# Patient Record
Sex: Female | Born: 1990 | Race: Black or African American | Hispanic: No | Marital: Single
Health system: Southern US, Community
[De-identification: ages and names within clinical notes are randomized; demographics above are authoritative.]

## PROBLEM LIST (undated history)

## (undated) ENCOUNTER — Inpatient Hospital Stay (HOSPITAL_COMMUNITY): Payer: Self-pay

## (undated) DIAGNOSIS — A549 Gonococcal infection, unspecified: Secondary | ICD-10-CM

## (undated) DIAGNOSIS — A599 Trichomoniasis, unspecified: Secondary | ICD-10-CM

## (undated) DIAGNOSIS — Z789 Other specified health status: Secondary | ICD-10-CM

## (undated) DIAGNOSIS — Z8619 Personal history of other infectious and parasitic diseases: Secondary | ICD-10-CM

## (undated) HISTORY — PX: NO PAST SURGERIES: SHX2092

## (undated) HISTORY — DX: Personal history of other infectious and parasitic diseases: Z86.19

---
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
## 2009-07-03 ENCOUNTER — Inpatient Hospital Stay (HOSPITAL_COMMUNITY): Admission: AD | Admit: 2009-07-03 | Discharge: 2009-07-03 | Payer: Self-pay | Admitting: Family Medicine

## 2009-10-27 ENCOUNTER — Ambulatory Visit (HOSPITAL_COMMUNITY): Admission: RE | Admit: 2009-10-27 | Discharge: 2009-10-27 | Payer: Self-pay | Admitting: Family Medicine

## 2010-02-01 ENCOUNTER — Inpatient Hospital Stay (HOSPITAL_COMMUNITY)
Admission: AD | Admit: 2010-02-01 | Discharge: 2010-02-01 | Payer: Self-pay | Source: Home / Self Care | Attending: Obstetrics and Gynecology | Admitting: Obstetrics and Gynecology

## 2010-02-01 ENCOUNTER — Encounter (INDEPENDENT_AMBULATORY_CARE_PROVIDER_SITE_OTHER): Payer: Self-pay | Admitting: Obstetrics and Gynecology

## 2010-02-01 ENCOUNTER — Inpatient Hospital Stay (HOSPITAL_COMMUNITY)
Admission: AD | Admit: 2010-02-01 | Discharge: 2010-02-03 | Payer: Self-pay | Source: Home / Self Care | Attending: Obstetrics and Gynecology | Admitting: Obstetrics and Gynecology

## 2010-02-01 LAB — CBC
HCT: 31.4 % — ABNORMAL LOW (ref 36.0–46.0)
Hemoglobin: 9.8 g/dL — ABNORMAL LOW (ref 12.0–15.0)
MCH: 24 pg — ABNORMAL LOW (ref 26.0–34.0)
MCHC: 31.2 g/dL (ref 30.0–36.0)
MCV: 77 fL — ABNORMAL LOW (ref 78.0–100.0)
Platelets: 255 10*3/uL (ref 150–400)
RBC: 4.08 MIL/uL (ref 3.87–5.11)
RDW: 16.2 % — ABNORMAL HIGH (ref 11.5–15.5)
WBC: 8.8 10*3/uL (ref 4.0–10.5)

## 2010-02-01 LAB — RPR: RPR Ser Ql: NONREACTIVE

## 2010-02-02 LAB — CBC
HCT: 26.7 % — ABNORMAL LOW (ref 36.0–46.0)
Hemoglobin: 8.2 g/dL — ABNORMAL LOW (ref 12.0–15.0)
MCH: 24.1 pg — ABNORMAL LOW (ref 26.0–34.0)
MCHC: 30.7 g/dL (ref 30.0–36.0)
MCV: 78.5 fL (ref 78.0–100.0)
Platelets: 236 10*3/uL (ref 150–400)
RBC: 3.4 MIL/uL — ABNORMAL LOW (ref 3.87–5.11)
RDW: 16.4 % — ABNORMAL HIGH (ref 11.5–15.5)
WBC: 9.1 10*3/uL (ref 4.0–10.5)

## 2010-04-16 LAB — CBC
HCT: 39.9 % (ref 36.0–46.0)
Hemoglobin: 13.7 g/dL (ref 12.0–15.0)
MCHC: 34.3 g/dL (ref 30.0–36.0)
MCV: 94.3 fL (ref 78.0–100.0)
Platelets: 266 10*3/uL (ref 150–400)
RBC: 4.23 MIL/uL (ref 3.87–5.11)
RDW: 13.9 % (ref 11.5–15.5)
WBC: 8 10*3/uL (ref 4.0–10.5)

## 2010-04-16 LAB — URINALYSIS, ROUTINE W REFLEX MICROSCOPIC
Bilirubin Urine: NEGATIVE
Glucose, UA: NEGATIVE mg/dL
Hgb urine dipstick: NEGATIVE
Ketones, ur: NEGATIVE mg/dL
Nitrite: NEGATIVE
Protein, ur: NEGATIVE mg/dL
Specific Gravity, Urine: >1.03 — ABNORMAL HIGH (ref 1.005–1.030)
Urobilinogen, UA: 0.2 mg/dL (ref 0.0–1.0)
pH: 6 (ref 5.0–8.0)

## 2010-04-16 LAB — POCT PREGNANCY, URINE: Preg Test, Ur: POSITIVE

## 2010-04-16 LAB — GC/CHLAMYDIA PROBE AMP, GENITAL
Chlamydia, DNA Probe: NEGATIVE
GC Probe Amp, Genital: POSITIVE — AB

## 2010-04-16 LAB — WET PREP, GENITAL
Clue Cells Wet Prep HPF POC: NONE SEEN
Yeast Wet Prep HPF POC: NONE SEEN

## 2010-04-16 LAB — HCG, QUANTITATIVE, PREGNANCY: hCG, Beta Chain, Quant, S: 61884 m[IU]/mL — ABNORMAL HIGH (ref <5)

## 2010-05-10 LAB — URINE CULTURE: Colony Count: >=100000

## 2010-05-10 LAB — URINALYSIS, ROUTINE W REFLEX MICROSCOPIC
Bilirubin Urine: NEGATIVE
Glucose, UA: NEGATIVE mg/dL
Ketones, ur: NEGATIVE mg/dL
Nitrite: NEGATIVE
Protein, ur: 100 mg/dL — AB
Specific Gravity, Urine: 1.022 (ref 1.005–1.030)
Urobilinogen, UA: 1 mg/dL (ref 0.0–1.0)
pH: 7.5 (ref 5.0–8.0)

## 2010-05-10 LAB — POCT PREGNANCY, URINE: Preg Test, Ur: NEGATIVE

## 2010-05-10 LAB — URINE MICROSCOPIC-ADD ON

## 2010-06-12 NOTE — Discharge Summary (Signed)
Kristina Goodwin, Kristina Goodwin NO.:  [id number]   MEDICAL RECORD NO.:  [id number]          PATIENT TYPE:  INP   LOCATION:  9126                          FACILITY:  [facility]   PHYSICIAN:  Gerrit Friends. Aldona Bar, M.D.   DATE OF BIRTH:  [birth year]   DATE OF ADMISSION:  09/05/2007  DATE OF DISCHARGE:  09/08/2007                               DISCHARGE SUMMARY   DISCHARGE DIAGNOSES:  1. Term pregnancy, delivered 7 pounds 7 ounces female infant, Apgars 9      and 9.  2. Blood type O positive.  3. Postpartum hemorrhage and anemia.   PROCEDURE:  1. Normal spontaneous delivery.  2. Second-degree tear and repair.   SUMMARY:  This 20 year old primigravida presented at 40 weeks and 6 days  with ruptured membranes.  Her pregnancy was relatively benign.  At the  time of presentation, her cervix was 1-2 cm dilated, vertex was high.  Cervix was 50% effaced.  She was begun on intravenous Pitocin and  eventually began progressing and subsequently on the afternoon of August  2009, delivered a 7 pounds 7 ounces female infant with Apgar hours of 9  and 9 over second-degree tear, which was repaired without difficulty.  The patient did have some postpartum bleeding.  She was examined by Dr.  Edward Jolly in the early morning of September 07, 2007.  No vaginal hematoma was  palpable.  She was swollen.  The uterus was firm.  She was given  Methergine and did contract well and her subsequent hemoglobins were  stable with the final one being 7.2 on the morning of September 08, 2007.  At that time, her white counts was 10,800.  Her platelet count was  250,000.  On the morning of September 08, 2007, she was stable.  Uterus was  firm.  Bleeding was minimal.  Vital signs were stable.  The patient was  bottle feeding and was instructed on breast finders.  She was also given  all appropriate instructions per discharge brochure and understood all  instructions well.  Discharge medications include Feosol capsules - one  daily.   She will finish her prenatal vitamins - one daily and she was  given prescriptions for Tylox to use 1-2 every 4-6 hours as needed for  severe pain and Motrin 600 mg to use every 6 hours as needed for  cramping or pain.  She will return to the office for followup in  approximately 4 weeks' time or as needed.   CONDITION ON DISCHARGE:  Improved.      Gerrit Friends. Aldona Bar, M.D.  Electronically Signed     RMW/MEDQ  D:  09/08/2007  T:  09/08/2007  Job:  [id number]

## 2010-10-26 LAB — CBC
HCT: 22.5 — ABNORMAL LOW
HCT: 23.5 — ABNORMAL LOW
HCT: 28.4 — ABNORMAL LOW
Hemoglobin: 7.2 — CL
Hemoglobin: 7.5 — CL
Hemoglobin: 9.1 — ABNORMAL LOW
MCHC: 31.8
MCHC: 31.9
MCHC: 32
MCV: 78
MCV: 78.1
MCV: 78.8
Platelets: 216
Platelets: 250
Platelets: 289
RBC: 2.86 — ABNORMAL LOW
RBC: 3.01 — ABNORMAL LOW
RBC: 3.64 — ABNORMAL LOW
RDW: 18.8 — ABNORMAL HIGH
RDW: 18.8 — ABNORMAL HIGH
RDW: 19.1 — ABNORMAL HIGH
WBC: 10.8 — ABNORMAL HIGH
WBC: 17.5 — ABNORMAL HIGH
WBC: 8.6

## 2010-10-26 LAB — RPR: RPR Ser Ql: NONREACTIVE

## 2011-01-29 NOTE — L&D Delivery Note (Signed)
Delivery Note At 9:46 PM a viable and healthy female was delivered via  (Presentation: right occiput; anterior  ).  APGAR: 8, 9 ; weight Pending.   Placenta status: Intact, Spontaneous.  Cord: 3V   Pt was found to be completely dilated with an urge to push.  With 3-4 contractions she delivered the head to crowning.  With delivery of the head the anterior shoulder did not easily deliver. The patient was placed in McRoberts position and additional assistants were called to the room.  After the patient was placed in McRoberts the anterior shoulder easily delivered followed by the remainder of the body.  Infant cried vigorously after stimulation and was placed on the maternal abdomen.  Cord was clamped and cut and the placenta was delivered spontaneously, intact, with 3V cord.  No lacerations required repair.  Mother and baby are doing well after delivery.  Duration of shoulder dystocia less than 15 seconds.  EBL 350 cc  Anesthesia: Epidural  Episiotomy: None Lacerations: None Suture Repair: None Est. Blood Loss (mL): 350cc  Mom to postpartum.  Baby to nursery-stable.  Jude Linck H. 11/19/2011, 10:06 PM

## 2011-04-12 ENCOUNTER — Encounter (HOSPITAL_COMMUNITY): Payer: Self-pay | Admitting: *Deleted

## 2011-04-12 ENCOUNTER — Inpatient Hospital Stay (HOSPITAL_COMMUNITY)
Admission: AD | Admit: 2011-04-12 | Discharge: 2011-04-12 | Disposition: A | Discharge status: Home / Self Care | Payer: Self-pay | Source: Ambulatory Visit | Attending: Obstetrics & Gynecology | Admitting: Obstetrics & Gynecology

## 2011-04-12 DIAGNOSIS — O210 Mild hyperemesis gravidarum: Secondary | ICD-10-CM | POA: Insufficient documentation

## 2011-04-12 DIAGNOSIS — N898 Other specified noninflammatory disorders of vagina: Secondary | ICD-10-CM

## 2011-04-12 DIAGNOSIS — O219 Vomiting of pregnancy, unspecified: Secondary | ICD-10-CM

## 2011-04-12 DIAGNOSIS — R109 Unspecified abdominal pain: Secondary | ICD-10-CM | POA: Insufficient documentation

## 2011-04-12 HISTORY — DX: Trichomoniasis, unspecified: A59.9

## 2011-04-12 HISTORY — DX: Gonococcal infection, unspecified: A54.9

## 2011-04-12 LAB — URINALYSIS, ROUTINE W REFLEX MICROSCOPIC
Bilirubin Urine: NEGATIVE
Glucose, UA: NEGATIVE mg/dL
Ketones, ur: 15 mg/dL — AB
Leukocytes, UA: NEGATIVE
Nitrite: NEGATIVE
Protein, ur: NEGATIVE mg/dL
Specific Gravity, Urine: 1.025 (ref 1.005–1.030)
Urobilinogen, UA: 1 mg/dL (ref 0.0–1.0)
pH: 6.5 (ref 5.0–8.0)

## 2011-04-12 LAB — URINE MICROSCOPIC-ADD ON

## 2011-04-12 LAB — WET PREP, GENITAL: Clue Cells Wet Prep HPF POC: NONE SEEN

## 2011-04-12 LAB — POCT PREGNANCY, URINE: Preg Test, Ur: POSITIVE — AB

## 2011-04-12 LAB — PREGNANCY, URINE: Preg Test, Ur: POSITIVE — AB

## 2011-04-12 MED ORDER — ONDANSETRON HCL 4 MG PO TABS
4.0000 mg | ORAL_TABLET | Freq: Once | ORAL | Status: AC
  Administered 2011-04-12: 4 mg via ORAL
  Filled 2011-04-12: qty 1

## 2011-04-12 MED ORDER — ONDANSETRON HCL 4 MG PO TABS: 4.0000 mg | ORAL_TABLET | Freq: Four times a day (QID) | ORAL | Status: AC

## 2011-04-12 NOTE — Discharge Instructions (Signed)
Morning Sickness Morning sickness is when you feel sick to your stomach (nauseous) during pregnancy. You may feel sick to your stomach and throw up (vomit). You may feel sick in the morning, but you can feel this way any time of day. Some women feel very sick to their stomach and cannot stop throwing up (hyperemesis gravidarum). HOME CARE  Take multivitamins as told by your doctor. Taking multivitamins before getting pregnant can stop or lessen the harshness of morning sickness.   Eat dry toast or unsalted crackers before getting out of bed.   Eat 5 to 6 small meals a day.   Eat dry and bland foods like rice and baked potatoes.   Do not drink liquids with meals. Drink between meals.   Do not eat greasy, fatty, or spicy foods.   Have someone cook for you if the smell of food causes you to feel sick or throw up.   Do not take vitamins with iron, or as told by your doctor.   Eat protein when you need a snack (nuts, yogurt, cheese).   Eat unsweetened gelatins for dessert.   Wear a bracelet used for sea sickness (acupressure wristband).   Go to a doctor that puts thin needles into certain body points (acupuncture) to improve how you feel.   Do not smoke.   Use a humidifier to keep the air in your house free of odors.  GET HELP RIGHT AWAY IF:   You feel very sick to your stomach and cannot stop throwing up.   You pass out (faint).   You have a fever.   You need medicine to feel better.   You feel dizzy or lightheaded.   You are losing weight.   You need help knowing what to eat and what not to eat.  MAKE SURE YOU:   Understand these instructions.   Will watch your condition.   Will get help right away if you are not doing well or get worse.  Document Released: 02/22/2004 Document Revised: 01/03/2011 Document Reviewed: 04/13/2009 Exit[employer] Patient Information 2012 [employer]. 
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
## 2011-04-12 NOTE — MAU Note (Signed)
Pt has been nauseated x2 weeks.

## 2011-04-12 NOTE — MAU Provider Note (Signed)
History   Kristina Goodwin is a 21 yo G3P2002 at [redacted]w[redacted]d presenting with nausea, vomiting, and pelvic pain.   CSN: [id number]  Arrival date and time: 04/12/11 1318   First Provider Initiated Contact with Patient 04/12/11 1535      Chief Complaint  Patient presents with  . Abdominal Pain   HPI Kristina Goodwin is a 21 yo G3P2002 at [redacted]w[redacted]d presenting with nausea, vomiting, and pelvic pain for 2 weeks.  She describes the pelvic pain as intermittent cramping similar to menstrual cramps rated 4-5/10.  She is able to keep most fluids down, but only able minimal solid foods.  She also states she has had a thick whitish discharge for 2 weeks.  She had a trichomonas infection about 1 month ago.  She denies diarrhea, fever, body aches, chills, dysuria, new sexual partners.  She did not know she was pregnant until her pregnancy test done here.  She plans on returning to [facility] care.    OB History    Grav Para Term Preterm Abortions TAB SAB Ect Mult Living   3 2 2  0 0 0 0 0 0 2      Past Medical History  Diagnosis Date  . Trichomonas   . Gonorrhea     History reviewed. No pertinent past surgical history.  Family History  Problem Relation Age of Onset  . Asthma Father   . Asthma Brother   . Diabetes Maternal Uncle   . Diabetes Maternal Grandmother     History  Substance Use Topics  . Smoking status: Current Everyday Smoker -- 0.2 packs/day    Types: Cigarettes  . Smokeless tobacco: Never Used  . Alcohol Use: No    Allergies: No Known Allergies  No prescriptions prior to admission    Review of Systems  Constitutional: Negative for fever, chills and malaise/fatigue.  Gastrointestinal: Positive for nausea, vomiting and abdominal pain (pelvic cramping). Negative for diarrhea.  Genitourinary: Negative for dysuria and urgency.   Physical Exam   Blood pressure 130/75, pulse 111, temperature 97.5 F (36.4 C), temperature source Oral, resp. rate 16, height 5\' 4"   (1.626 m), last menstrual period 02/12/2011, SpO2 100.00%.  Physical Exam  Constitutional: She is oriented to person, place, and time. She appears well-developed and well-nourished. No distress.  HENT:  Head: Normocephalic and atraumatic.  Neck: Neck supple.  Respiratory: Effort normal.  GI: Soft. Bowel sounds are normal. She exhibits no distension. There is no tenderness.  Genitourinary: Cervix exhibits discharge (thick white discharge). Cervix exhibits no motion tenderness and no friability. Right adnexum displays tenderness (mild). Left adnexum displays tenderness (mild). Vaginal discharge (thick white discharge noted) found.  Musculoskeletal: Normal range of motion.  Neurological: She is alert and oriented to person, place, and time.    MAU Course  Procedures Results for orders placed during the hospital encounter of 04/12/11 (from the past 24 hour(s))  URINALYSIS, ROUTINE W REFLEX MICROSCOPIC     Status: Abnormal   Collection Time   04/12/11  2:00 PM      Component Value Range   Color, Urine AMBER (*) YELLOW    APPearance HAZY (*) CLEAR    Specific Gravity, Urine 1.025  1.005 - 1.030    pH 6.5  5.0 - 8.0    Glucose, UA NEGATIVE  NEGATIVE (mg/dL)   Hgb urine dipstick TRACE (*) NEGATIVE    Bilirubin Urine NEGATIVE  NEGATIVE    Ketones, ur 15 (*) NEGATIVE (mg/dL)   Protein,  ur NEGATIVE  NEGATIVE (mg/dL)   Urobilinogen, UA 1.0  0.0 - 1.0 (mg/dL)   Nitrite NEGATIVE  NEGATIVE    Leukocytes, UA NEGATIVE  NEGATIVE   PREGNANCY, URINE     Status: Abnormal   Collection Time   04/12/11  2:00 PM      Component Value Range   Preg Test, Ur POSITIVE (*) NEGATIVE   URINE MICROSCOPIC-ADD ON     Status: Abnormal   Collection Time   04/12/11  2:00 PM      Component Value Range   Squamous Epithelial / LPF FEW (*) RARE    WBC, UA 7-10  <3 (WBC/hpf)   RBC / HPF 7-10  <3 (RBC/hpf)   Bacteria, UA MANY (*) RARE    Urine-Other MUCOUS PRESENT    POCT PREGNANCY, URINE     Status: Abnormal    Collection Time   04/12/11  2:06 PM      Component Value Range   Preg Test, Ur POSITIVE (*) NEGATIVE   WET PREP, GENITAL     Status: Abnormal   Collection Time   04/12/11  3:39 PM      Component Value Range   Yeast Wet Prep HPF POC NONE SEEN  NONE SEEN    Trich, Wet Prep NONE SEEN  NONE SEEN    Clue Cells Wet Prep HPF POC NONE SEEN  NONE SEEN    WBC, Wet Prep HPF POC FEW (*) NONE SEEN     Assessment and Plan  Assessment Pregnancy  Nausea and vomiting  Plan Obtained samples for pregnancy, UA with micro, GC/Ch, wet prep.  Results reviewed and listed above except GC/Ch.  Will call patient with results and treatment if positive.     PO Zofran for N/V given in MAU.  Informed patient to continue to eat small frequent meals and to drink plenty of fluid. Patient to seek new OB appointment with [facility].   Discharge home.  Mardene Speak 04/12/2011, 3:46 PM   I have seen and examined this patient in conjunction with Mardene Speak, PA-S.  I have taken this history and performed the exam.  I agree with the note as written above and have made corrections as needed.   Candelaria Celeste JEHIEL 04/12/2011 4:36 PM

## 2011-04-15 ENCOUNTER — Other Ambulatory Visit: Payer: Self-pay | Admitting: Family Medicine

## 2011-04-15 LAB — GC/CHLAMYDIA PROBE AMP, GENITAL: Chlamydia, DNA Probe: POSITIVE — AB

## 2011-04-15 MED ORDER — AZITHROMYCIN 500 MG PO TABS: 1000.0000 mg | ORAL_TABLET | Freq: Once | ORAL | Status: AC

## 2011-07-01 ENCOUNTER — Other Ambulatory Visit: Payer: Self-pay

## 2011-07-04 LAB — OB RESULTS CONSOLE HIV ANTIBODY (ROUTINE TESTING): HIV: NONREACTIVE

## 2011-07-04 LAB — OB RESULTS CONSOLE ANTIBODY SCREEN: Antibody Screen: NEGATIVE

## 2011-07-04 LAB — OB RESULTS CONSOLE RPR: RPR: NONREACTIVE

## 2011-07-04 LAB — OB RESULTS CONSOLE HEPATITIS B SURFACE ANTIGEN: Hepatitis B Surface Ag: NEGATIVE

## 2011-10-15 LAB — OB RESULTS CONSOLE GBS: GBS: POSITIVE

## 2011-11-18 ENCOUNTER — Encounter (HOSPITAL_COMMUNITY): Payer: Self-pay | Admitting: *Deleted

## 2011-11-18 ENCOUNTER — Telehealth (HOSPITAL_COMMUNITY): Payer: Self-pay | Admitting: *Deleted

## 2011-11-18 NOTE — Telephone Encounter (Signed)
Preadmission screen  

## 2011-11-19 ENCOUNTER — Inpatient Hospital Stay (HOSPITAL_COMMUNITY): Payer: Medicaid Other | Admitting: Anesthesiology

## 2011-11-19 ENCOUNTER — Encounter (HOSPITAL_COMMUNITY): Payer: Self-pay | Admitting: Anesthesiology

## 2011-11-19 ENCOUNTER — Encounter (HOSPITAL_COMMUNITY): Payer: Self-pay | Admitting: *Deleted

## 2011-11-19 ENCOUNTER — Encounter (HOSPITAL_COMMUNITY): Payer: Self-pay | Admitting: Family Medicine

## 2011-11-19 ENCOUNTER — Inpatient Hospital Stay (HOSPITAL_COMMUNITY)
Admission: AD | Admit: 2011-11-19 | Discharge: 2011-11-21 | DRG: 775 | Disposition: A | Discharge status: Home / Self Care | Payer: Medicaid Other | Source: Ambulatory Visit | Attending: Obstetrics and Gynecology | Admitting: Obstetrics and Gynecology

## 2011-11-19 DIAGNOSIS — O2650 Maternal hypotension syndrome, unspecified trimester: Secondary | ICD-10-CM | POA: Diagnosis not present

## 2011-11-19 DIAGNOSIS — O9903 Anemia complicating the puerperium: Principal | ICD-10-CM | POA: Diagnosis not present

## 2011-11-19 DIAGNOSIS — D649 Anemia, unspecified: Secondary | ICD-10-CM | POA: Diagnosis not present

## 2011-11-19 LAB — CBC
Hemoglobin: 9 g/dL — ABNORMAL LOW (ref 12.0–15.0)
MCH: 23.6 pg — ABNORMAL LOW (ref 26.0–34.0)
RBC: 3.82 MIL/uL — ABNORMAL LOW (ref 3.87–5.11)
WBC: 10.8 10*3/uL — ABNORMAL HIGH (ref 4.0–10.5)

## 2011-11-19 LAB — RPR: RPR Ser Ql: NONREACTIVE

## 2011-11-19 MED ORDER — ACETAMINOPHEN 325 MG PO TABS: 650.0000 mg | ORAL_TABLET | ORAL | Status: DC | PRN

## 2011-11-19 MED ORDER — EPHEDRINE 5 MG/ML INJ
10.0000 mg | INTRAVENOUS | Status: DC | PRN
  Filled 2011-11-19: qty 4

## 2011-11-19 MED ORDER — SENNOSIDES-DOCUSATE SODIUM 8.6-50 MG PO TABS
2.0000 | ORAL_TABLET | Freq: Every day | ORAL | Status: DC
  Administered 2011-11-20: 2 via ORAL

## 2011-11-19 MED ORDER — IBUPROFEN 600 MG PO TABS: 600.0000 mg | ORAL_TABLET | Freq: Four times a day (QID) | ORAL | Status: DC | PRN

## 2011-11-19 MED ORDER — EPHEDRINE 5 MG/ML INJ: 10.0000 mg | INTRAVENOUS | Status: DC | PRN

## 2011-11-19 MED ORDER — OXYCODONE-ACETAMINOPHEN 5-325 MG PO TABS: 1.0000 | ORAL_TABLET | ORAL | Status: DC | PRN

## 2011-11-19 MED ORDER — SIMETHICONE 80 MG PO CHEW: 80.0000 mg | CHEWABLE_TABLET | ORAL | Status: DC | PRN

## 2011-11-19 MED ORDER — FLEET ENEMA 7-19 GM/118ML RE ENEM: 1.0000 | ENEMA | RECTAL | Status: DC | PRN

## 2011-11-19 MED ORDER — CITRIC ACID-SODIUM CITRATE 334-500 MG/5ML PO SOLN: 30.0000 mL | ORAL | Status: DC | PRN

## 2011-11-19 MED ORDER — FENTANYL 2.5 MCG/ML BUPIVACAINE 1/10 % EPIDURAL INFUSION (WH - ANES)
INTRAMUSCULAR | Status: DC | PRN
  Administered 2011-11-19: 14 mL/h via EPIDURAL

## 2011-11-19 MED ORDER — LIDOCAINE HCL (PF) 1 % IJ SOLN
30.0000 mL | INTRAMUSCULAR | Status: DC | PRN
  Filled 2011-11-19: qty 30

## 2011-11-19 MED ORDER — LACTATED RINGERS IV SOLN: 500.0000 mL | Freq: Once | INTRAVENOUS | Status: DC

## 2011-11-19 MED ORDER — TETANUS-DIPHTH-ACELL PERTUSSIS 5-2.5-18.5 LF-MCG/0.5 IM SUSP
0.5000 mL | Freq: Once | INTRAMUSCULAR | Status: AC
Start: 2011-11-20 — End: 2011-11-20
  Administered 2011-11-20: 0.5 mL via INTRAMUSCULAR
  Filled 2011-11-19 (×2): qty 0.5

## 2011-11-19 MED ORDER — LACTATED RINGERS IV SOLN: 500.0000 mL | INTRAVENOUS | Status: DC | PRN

## 2011-11-19 MED ORDER — DIBUCAINE 1 % RE OINT
1.0000 "application " | TOPICAL_OINTMENT | RECTAL | Status: DC | PRN
  Filled 2011-11-19: qty 28

## 2011-11-19 MED ORDER — ZOLPIDEM TARTRATE 5 MG PO TABS: 5.0000 mg | ORAL_TABLET | Freq: Every evening | ORAL | Status: DC | PRN

## 2011-11-19 MED ORDER — LACTATED RINGERS IV SOLN: INTRAVENOUS | Status: DC

## 2011-11-19 MED ORDER — DIPHENHYDRAMINE HCL 50 MG/ML IJ SOLN: 12.5000 mg | INTRAMUSCULAR | Status: DC | PRN

## 2011-11-19 MED ORDER — DIPHENHYDRAMINE HCL 25 MG PO CAPS: 25.0000 mg | ORAL_CAPSULE | Freq: Four times a day (QID) | ORAL | Status: DC | PRN

## 2011-11-19 MED ORDER — IBUPROFEN 600 MG PO TABS
600.0000 mg | ORAL_TABLET | Freq: Four times a day (QID) | ORAL | Status: DC
  Administered 2011-11-20 – 2011-11-21 (×6): 600 mg via ORAL
  Filled 2011-11-19 (×6): qty 1

## 2011-11-19 MED ORDER — OXYTOCIN BOLUS FROM INFUSION
500.0000 mL | INTRAVENOUS | Status: DC
  Administered 2011-11-19: 500 mL via INTRAVENOUS
  Filled 2011-11-19 (×44): qty 500

## 2011-11-19 MED ORDER — LIDOCAINE HCL (PF) 1 % IJ SOLN
INTRAMUSCULAR | Status: DC | PRN
  Administered 2011-11-19 (×2): 4 mL

## 2011-11-19 MED ORDER — OXYTOCIN 40 UNITS IN LACTATED RINGERS INFUSION - SIMPLE MED
62.5000 mL/h | INTRAVENOUS | Status: DC
  Filled 2011-11-19: qty 1000

## 2011-11-19 MED ORDER — PRENATAL MULTIVITAMIN CH
1.0000 | ORAL_TABLET | Freq: Every day | ORAL | Status: DC
  Administered 2011-11-20: 1 via ORAL
  Filled 2011-11-19 (×2): qty 1

## 2011-11-19 MED ORDER — ONDANSETRON HCL 4 MG/2ML IJ SOLN: 4.0000 mg | INTRAMUSCULAR | Status: DC | PRN

## 2011-11-19 MED ORDER — ONDANSETRON HCL 4 MG/2ML IJ SOLN: 4.0000 mg | Freq: Four times a day (QID) | INTRAMUSCULAR | Status: DC | PRN

## 2011-11-19 MED ORDER — TERBUTALINE SULFATE 1 MG/ML IJ SOLN: 0.2500 mg | Freq: Once | INTRAMUSCULAR | Status: DC | PRN

## 2011-11-19 MED ORDER — FENTANYL 2.5 MCG/ML BUPIVACAINE 1/10 % EPIDURAL INFUSION (WH - ANES)
14.0000 mL/h | INTRAMUSCULAR | Status: DC
  Filled 2011-11-19: qty 125

## 2011-11-19 MED ORDER — WITCH HAZEL-GLYCERIN EX PADS: 1.0000 "application " | MEDICATED_PAD | CUTANEOUS | Status: DC | PRN

## 2011-11-19 MED ORDER — ONDANSETRON HCL 4 MG PO TABS: 4.0000 mg | ORAL_TABLET | ORAL | Status: DC | PRN

## 2011-11-19 MED ORDER — OXYTOCIN 40 UNITS IN LACTATED RINGERS INFUSION - SIMPLE MED
1.0000 m[IU]/min | INTRAVENOUS | Status: DC
  Administered 2011-11-19: 2 m[IU]/min via INTRAVENOUS

## 2011-11-19 MED ORDER — PHENYLEPHRINE 40 MCG/ML (10ML) SYRINGE FOR IV PUSH (FOR BLOOD PRESSURE SUPPORT): 80.0000 ug | PREFILLED_SYRINGE | INTRAVENOUS | Status: DC | PRN

## 2011-11-19 MED ORDER — LANOLIN HYDROUS EX OINT: TOPICAL_OINTMENT | CUTANEOUS | Status: DC | PRN

## 2011-11-19 MED ORDER — PHENYLEPHRINE 40 MCG/ML (10ML) SYRINGE FOR IV PUSH (FOR BLOOD PRESSURE SUPPORT)
80.0000 ug | PREFILLED_SYRINGE | INTRAVENOUS | Status: DC | PRN
  Filled 2011-11-19: qty 5

## 2011-11-19 MED ORDER — SODIUM CHLORIDE 0.9 % IV SOLN
2.0000 g | Freq: Once | INTRAVENOUS | Status: AC
  Administered 2011-11-19: 2 g via INTRAVENOUS
  Filled 2011-11-19: qty 2000

## 2011-11-19 MED ORDER — BENZOCAINE-MENTHOL 20-0.5 % EX AERO
1.0000 "application " | INHALATION_SPRAY | CUTANEOUS | Status: DC | PRN
  Filled 2011-11-19: qty 56

## 2011-11-19 NOTE — MAU Note (Signed)
Contractions started last night, has gotten worse this afternoon, now every 5 min.  No bleeding or leaking.  Was 3cm yesterday. No problems with preg.

## 2011-11-19 NOTE — Anesthesia Procedure Notes (Signed)
Epidural Patient location during procedure: OB Start time: 11/19/2011 5:55 PM  Staffing Anesthesiologist: Altair Stanko A. Performed by: anesthesiologist   Preanesthetic Checklist Completed: patient identified, site marked, surgical consent, pre-op evaluation, timeout performed, IV checked, risks and benefits discussed and monitors and equipment checked  Epidural Patient position: sitting Prep: site prepped and draped and DuraPrep Patient monitoring: continuous pulse ox and blood pressure Approach: midline Injection technique: LOR air  Needle:  Needle type: Tuohy  Needle gauge: 17 G Needle length: 9 cm and 9 Needle insertion depth: 6 cm Catheter type: closed end flexible Catheter size: 19 Gauge Catheter at skin depth: 11 cm Test dose: negative and Other  Assessment Events: blood not aspirated, injection not painful, no injection resistance, negative IV test and no paresthesia  Additional Notes Patient identified. Risks and benefits discussed including failed block, incomplete  Pain control, post dural puncture headache, nerve damage, paralysis, blood pressure Changes, nausea, vomiting, reactions to medications-both toxic and allergic and post Partum back pain. All questions were answered. Patient expressed understanding and wished to proceed. Sterile technique was used throughout procedure. Epidural site was Dressed with sterile barrier dressing. No paresthesias, signs of intravascular injection Or signs of intrathecal spread were encountered.  Patient was more comfortable after the epidural was dosed. Please see RN's note for documentation of vital signs and FHR which are stable.

## 2011-11-19 NOTE — Anesthesia Preprocedure Evaluation (Signed)
Anesthesia Evaluation  Patient identified by MRN, date of birth, ID band Patient awake    Reviewed: Allergy & Precautions, H&P , Patient's Chart, lab work & pertinent test results  Airway Mallampati: III TM Distance: >3 FB Neck ROM: full    Dental No notable dental hx. (+) Teeth Intact   Pulmonary former smoker,  breath sounds clear to auscultation  Pulmonary exam normal       Cardiovascular negative cardio ROS  Rhythm:regular Rate:Normal     Neuro/Psych negative neurological ROS  negative psych ROS   GI/Hepatic negative GI ROS, (+)     substance abuse  marijuana use,   Endo/Other  negative endocrine ROS  Renal/GU negative Renal ROS  negative genitourinary   Musculoskeletal   Abdominal Normal abdominal exam  (+)   Peds  Hematology negative hematology ROS (+)   Anesthesia Other Findings   Reproductive/Obstetrics (+) Pregnancy                           Anesthesia Physical Anesthesia Plan  ASA: II  Anesthesia Plan: Epidural   Post-op Pain Management:    Induction:   Airway Management Planned:   Additional Equipment:   Intra-op Plan:   Post-operative Plan:   Informed Consent: I have reviewed the patients History and Physical, chart, labs and discussed the procedure including the risks, benefits and alternatives for the proposed anesthesia with the patient or authorized representative who has indicated his/her understanding and acceptance.     Plan Discussed with: Anesthesiologist  Anesthesia Plan Comments:         Anesthesia Quick Evaluation
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
## 2011-11-20 LAB — CBC
HCT: 26.4 % — ABNORMAL LOW (ref 36.0–46.0)
Hemoglobin: 8.1 g/dL — ABNORMAL LOW (ref 12.0–15.0)
MCV: 76.7 fL — ABNORMAL LOW (ref 78.0–100.0)
Platelets: 195 10*3/uL (ref 150–400)
RBC: 3.44 MIL/uL — ABNORMAL LOW (ref 3.87–5.11)
WBC: 16.8 10*3/uL — ABNORMAL HIGH (ref 4.0–10.5)

## 2011-11-20 NOTE — Progress Notes (Signed)
Post Partum Day 1 Subjective: no complaints  Objective: Blood pressure 111/64, pulse 99, temperature 99 F (37.2 C), temperature source Oral, resp. rate 18, height 5\' 3"  (1.6 m), weight 206 lb (93.441 kg), last menstrual period 02/12/2011, SpO2 98.00%, unknown if currently breastfeeding.  Physical Exam:  General: alert Lochia: appropriate Uterine Fundus: firm   Basename 11/20/11 0530 11/19/11 1710  HGB 8.1* 9.0*  HCT 26.4* 29.4*    Assessment/Plan: Plan for discharge tomorrow   LOS: 1 day   Kristina Goodwin D 11/20/2011, 9:07 AM

## 2011-11-20 NOTE — Anesthesia Postprocedure Evaluation (Signed)
  Anesthesia Post-op Note  Patient: Kristina Goodwin  Procedure(s) Performed: * No procedures listed *  Patient Location: Mother/Baby  Anesthesia Type: Epidural  Level of Consciousness: awake, alert  and oriented  Airway and Oxygen Therapy: Patient Spontanous Breathing  Post-op Pain: none  Post-op Assessment: Post-op Vital signs reviewed, Patient's Cardiovascular Status Stable, No headache, No backache, No residual numbness and No residual motor weakness  Post-op Vital Signs: Reviewed and stable  Complications: No apparent anesthesia complications
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
## 2011-11-20 NOTE — Progress Notes (Signed)
Patient called out and asked for her linen to be changed. Arrived in room at 0640 and patient assisted to bathroom. Blood noted to gown and underpad. When patient in bathroom, stated that she felt dizzy. Assisted pt to toilet and placed cool cloth to forehead. Patient passed out. Emergency light pulled. At 0645 [facility] RN in bathroom with amonia inhalant. Patient aroused, talking but closing eyes. Encouraged to talk with nurses and to keep eyes opened. Patient passed out again. amonia inhalant to nostrils. Patient aroused momentarily and then passed out again. 1610 Emergency light pulled again, Hart Carwin RN in room. Patient aroused and talking with nurses, sweaty and drowsy. 9604 patient back to bed with stedy and assist x 3 nurses. Fundal massage, firm at umb, no trickle noted. Small lochia. Vital signs taken. Patient told not to get out of bed by herself. Must call staff for assistance.

## 2011-11-20 NOTE — Progress Notes (Signed)
Head delivered at 21:46. 15 second shoulder dystocia. Additional assistance called and McRoberts preformed. NSVD of viable female.
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
## 2011-11-20 NOTE — Progress Notes (Signed)
Ur chart review completed.  

## 2011-11-21 NOTE — Progress Notes (Signed)
Patient is eating, ambulating, voiding.  Pain control is good.  Filed Vitals:   11/20/11 0655 11/20/11 1405 11/20/11 2105 11/21/11 0530  BP: 111/64 107/74 116/77 119/79  Pulse: 99 105 102 93  Temp:  98.1 F (36.7 C) 98.1 F (36.7 C) 97.9 F (36.6 C)  TempSrc:  Oral Oral Oral  Resp:  19 18 20   Height:      Weight:      SpO2:    99%    Fundus firm Perineum without swelling.  Lab Results  Component Value Date   WBC 16.8* 11/20/2011   HGB 8.1* 11/20/2011   HCT 26.4* 11/20/2011   MCV 76.7* 11/20/2011   PLT 195 11/20/2011    O/Positive/-- (06/06 0000)/RI  A/P Post partum day 2.  Routine care.  Expect d/c today.    Kal Chait A

## 2011-11-21 NOTE — Discharge Summary (Signed)
Obstetric Discharge Summary Reason for Admission: onset of labor Prenatal Procedures: none Intrapartum Procedures: spontaneous vaginal delivery Postpartum Procedures: none Complications-Operative and Postpartum: none Hemoglobin  Date Value Range Status  11/20/2011 8.1* 12.0 - 15.0 g/dL Final     HCT  Date Value Range Status  11/20/2011 26.4* 36.0 - 46.0 % Final     Discharge Diagnoses: Term Pregnancy-delivered and anemia  Discharge Information: Date: 11/21/2011 Activity: unrestricted and pelvic rest Diet: routine Medications: ibuprophen Condition: stable Instructions: refer to practice specific booklet Discharge to: home Follow-up Information    Follow up with Almon Hercules., MD. Schedule an appointment as soon as possible for a visit in 4 weeks.   Contact information:   [address] [place] [address] [phone]          Newborn Data: Live born female  Birth Weight: 9 lb 13.5 oz (4465 g) APGAR: 8, 9  Home with mother.  Kristina Goodwin A 11/21/2011, 8:35 AM

## 2011-11-26 ENCOUNTER — Inpatient Hospital Stay (HOSPITAL_COMMUNITY): Admission: RE | Admit: 2011-11-26 | Payer: Medicaid Other | Source: Ambulatory Visit

## 2011-12-02 NOTE — H&P (Signed)
Kristina Goodwin is a 21 y.o. female presenting for labor History OB History    Grav Para Term Preterm Abortions TAB SAB Ect Mult Living   3 3 3  0 0 0 0 0 0 3     Past Medical History  Diagnosis Date  . Trichomonas   . Gonorrhea   . H/O varicella   . Hx of chlamydia infection    Past Surgical History  Procedure Date  . No past surgeries    Family History: family history includes Asthma in her brother and father; Diabetes in her maternal grandmother and maternal uncle; Hypertension in her maternal uncle; Thyroid disease in her cousin; and Vision loss in her mother. Social History:  reports that she quit smoking about 7 months ago. Her smoking use included Cigarettes. She smoked .25 packs per day. She has never used smokeless tobacco. She reports that she uses illicit drugs (Marijuana) about 5 times per week. She reports that she does not drink alcohol.   Prenatal Transfer Tool  Maternal Diabetes: No Genetic Screening: Declined Maternal Ultrasounds/Referrals: Normal Fetal Ultrasounds or other Referrals:  None Maternal Substance Abuse:  No Significant Maternal Medications:  None Significant Maternal Lab Results:  None Other Comments:  None  ROS: as above  Dilation: 10 Effacement (%): 100 Station: +2 Exam by:: T.Lessard RN Blood pressure 119/79, pulse 93, temperature 97.9 F (36.6 C), temperature source Oral, resp. rate 20, height 5\' 3"  (1.6 m), weight 93.441 kg (206 lb), last menstrual period 02/12/2011, SpO2 99.00%, unknown if currently breastfeeding. Exam Physical Exam  Prenatal labs: ABO, Rh: O/Positive/-- (06/06 0000) Antibody: Negative (06/06 0000) Rubella: Immune (06/06 0000) RPR: NON REACTIVE (10/22 1710)  HBsAg: Negative (06/06 0000)  HIV: Non-reactive (06/06 0000)  GBS: Positive (09/17 0000)   Assessment/Plan: Admit epidural  [name] H. 12/02/2011, 2:26 AM

## 2013-11-29 ENCOUNTER — Encounter (HOSPITAL_COMMUNITY): Payer: Self-pay | Admitting: Family Medicine

## 2016-03-26 ENCOUNTER — Emergency Department (HOSPITAL_COMMUNITY)
Admission: EM | Admit: 2016-03-26 | Discharge: 2016-03-27 | Disposition: A | Discharge status: Home / Self Care | Payer: Medicaid Other | Attending: Emergency Medicine | Admitting: Emergency Medicine

## 2016-03-26 ENCOUNTER — Encounter (HOSPITAL_COMMUNITY): Payer: Self-pay | Admitting: Emergency Medicine

## 2016-03-26 DIAGNOSIS — Z87891 Personal history of nicotine dependence: Secondary | ICD-10-CM | POA: Insufficient documentation

## 2016-03-26 DIAGNOSIS — K0530 Chronic periodontitis, unspecified: Secondary | ICD-10-CM | POA: Insufficient documentation

## 2016-03-26 DIAGNOSIS — H9202 Otalgia, left ear: Secondary | ICD-10-CM | POA: Insufficient documentation

## 2016-03-26 DIAGNOSIS — K047 Periapical abscess without sinus: Secondary | ICD-10-CM | POA: Insufficient documentation

## 2016-03-26 NOTE — ED Triage Notes (Signed)
Pt states she is been having tooth pain on her left side 8/10 that radiates to her left ear. Denies any fever, no facial swollen noticed.

## 2016-03-27 MED ORDER — AMOXICILLIN 500 MG PO CAPS
500.0000 mg | ORAL_CAPSULE | Freq: Once | ORAL | Status: AC
  Administered 2016-03-27: 500 mg via ORAL
  Filled 2016-03-27: qty 1

## 2016-03-27 MED ORDER — AMOXICILLIN 500 MG PO CAPS: 500.0000 mg | ORAL_CAPSULE | Freq: Three times a day (TID) | ORAL | 0 refills | Status: DC

## 2016-03-27 NOTE — ED Provider Notes (Signed)
[facility]-EMERGENCY DEPT Provider Note   CSN: [id number] Arrival date & time: 03/26/16  2307     History   Chief Complaint Chief Complaint  Patient presents with  . Dental Pain  . Otalgia    HPI Kristina Goodwin is a 26 y.o. female who presents to the ED with left lower dental pain that started 2 weeks ago and has gotten progressively worse. She reports left ear pain as well. She has been taking tylenol and ibuprofen with some relief.   The history is provided by the patient. No language interpreter was used.  Dental Pain   This is a new problem. The current episode started more than 1 week ago. The problem occurs constantly. The problem has been gradually worsening. The pain is at a severity of 6/10. She has tried acetaminophen, heat and ice for the symptoms. The treatment provided mild relief.  Otalgia  This is a new problem. The current episode started more than 1 week ago. There is pain in the left ear. The problem occurs constantly. The problem has not changed since onset.There has been no fever. The pain is moderate. Pertinent negatives include no headaches, no sore throat, no abdominal pain, no vomiting, no neck pain, no cough and no rash. Associated symptoms comments: Dental pain.    Past Medical History:  Diagnosis Date  . Gonorrhea   . H/O varicella   . Hx of chlamydia infection   . Trichomonas     There are no active problems to display for this patient.   Past Surgical History:  Procedure Laterality Date  . NO PAST SURGERIES      OB History    Gravida Para Term Preterm AB Living   3 3 3  0 0 3   SAB TAB Ectopic Multiple Live Births   0 0 0 0 3       Home Medications    Prior to Admission medications   Medication Sig Start Date End Date Taking? Authorizing Provider  amoxicillin (AMOXIL) 500 MG capsule Take 1 capsule (500 mg total) by mouth 3 (three) times daily. 03/27/16   Izaya Netherton Orlene Och, NP    Family History Family History  Problem Relation Age of Onset    . Asthma Father   . Asthma Brother   . Diabetes Maternal Uncle   . Hypertension Maternal Uncle   . Diabetes Maternal Grandmother   . Thyroid disease Cousin   . Vision loss Mother     glacoma    Social History Social History  Substance Use Topics  . Smoking status: Former Smoker    Packs/day: 0.25    Types: Cigarettes    Quit date: 04/18/2011  . Smokeless tobacco: Never Used  . Alcohol use No     Allergies   Patient has no known allergies.   Review of Systems Review of Systems  Constitutional: Negative for chills and fever.  HENT: Positive for dental problem and ear pain. Negative for sore throat.   Respiratory: Negative for cough.   Cardiovascular: Negative for chest pain.  Gastrointestinal: Negative for abdominal pain, nausea and vomiting.  Musculoskeletal: Negative for neck pain.  Skin: Negative for rash.  Neurological: Negative for syncope and headaches.  Psychiatric/Behavioral: Negative for confusion.     Physical Exam Updated Vital Signs BP 123/91 (BP Location: Right Arm)   Pulse 69   Temp 98.1 F (36.7 C) (Oral)   Resp 22   Ht 5\' 4"  (1.626 m)   LMP 03/17/2016   SpO2  100%   Physical Exam  Constitutional: She is oriented to person, place, and time. She appears well-developed and well-nourished. No distress.  HENT:  Head: Normocephalic and atraumatic.  Right Ear: Tympanic membrane normal.  Left Ear: Tympanic membrane normal.  Nose: Nose normal.  Mouth/Throat: Uvula is midline and oropharynx is clear and moist. No trismus in the jaw. Dental caries present.    There is decay of the left lower second molar and partially erupted third molar with erythema of the gum surrounding the tooth.   Eyes: EOM are normal.  Neck: Neck supple.  Pulmonary/Chest: Effort normal.  Abdominal: Soft. There is no tenderness.  Musculoskeletal: Normal range of motion.  Lymphadenopathy:    She has no cervical adenopathy.  Neurological: She is alert and oriented to person,  place, and time. No cranial nerve deficit.  Skin: Skin is warm and dry.  Psychiatric: She has a normal mood and affect. Her behavior is normal.  Nursing note and vitals reviewed.    ED Treatments / Results  Labs (all labs ordered are listed, but only abnormal results are displayed) Labs Reviewed - No data to display Procedures Procedures (including critical care time)  Medications Ordered in ED Medications  amoxicillin (AMOXIL) capsule 500 mg (not administered)     Initial Impression / Assessment and Plan / ED Course  I have reviewed the triage vital signs and the nursing notes.   Final Clinical Impressions(s) / ED Diagnoses  26 y.o. female with dental pain stable for d/c without trismus and no signs of Ludwig's angina. Will treat for infection and she will follow up with a dentist as soon as possible.  Final diagnoses:  Dental infection  Left ear pain  Pericoronitis    New Prescriptions New Prescriptions   AMOXICILLIN (AMOXIL) 500 MG CAPSULE    Take 1 capsule (500 mg total) by mouth 3 (three) times daily.     [facility] Orlene OchM Giacomo Valone, NP 03/27/16 0052    Layla MawKristen N Ward, DO 03/27/16 [id number]

## 2017-07-14 ENCOUNTER — Emergency Department (HOSPITAL_COMMUNITY): Payer: Self-pay

## 2017-07-14 ENCOUNTER — Encounter (HOSPITAL_COMMUNITY): Payer: Self-pay | Admitting: Emergency Medicine

## 2017-07-14 ENCOUNTER — Other Ambulatory Visit: Payer: Self-pay

## 2017-07-14 ENCOUNTER — Emergency Department (HOSPITAL_COMMUNITY)
Admission: EM | Admit: 2017-07-14 | Discharge: 2017-07-14 | Disposition: A | Discharge status: Home / Self Care | Payer: Self-pay | Attending: Emergency Medicine | Admitting: Emergency Medicine

## 2017-07-14 DIAGNOSIS — Z87891 Personal history of nicotine dependence: Secondary | ICD-10-CM | POA: Insufficient documentation

## 2017-07-14 DIAGNOSIS — S60221A Contusion of right hand, initial encounter: Secondary | ICD-10-CM | POA: Insufficient documentation

## 2017-07-14 DIAGNOSIS — Y929 Unspecified place or not applicable: Secondary | ICD-10-CM | POA: Insufficient documentation

## 2017-07-14 DIAGNOSIS — K0889 Other specified disorders of teeth and supporting structures: Secondary | ICD-10-CM | POA: Insufficient documentation

## 2017-07-14 DIAGNOSIS — Y999 Unspecified external cause status: Secondary | ICD-10-CM | POA: Insufficient documentation

## 2017-07-14 DIAGNOSIS — W2209XA Striking against other stationary object, initial encounter: Secondary | ICD-10-CM | POA: Insufficient documentation

## 2017-07-14 DIAGNOSIS — Y939 Activity, unspecified: Secondary | ICD-10-CM | POA: Insufficient documentation

## 2017-07-14 MED ORDER — PENICILLIN V POTASSIUM 500 MG PO TABS: 500.0000 mg | ORAL_TABLET | Freq: Four times a day (QID) | ORAL | 0 refills | Status: AC

## 2017-07-14 MED ORDER — ACETAMINOPHEN 325 MG PO TABS
650.0000 mg | ORAL_TABLET | Freq: Once | ORAL | Status: AC
  Administered 2017-07-14: 650 mg via ORAL
  Filled 2017-07-14: qty 2

## 2017-07-14 MED ORDER — NAPROXEN 500 MG PO TABS: 500.0000 mg | ORAL_TABLET | Freq: Two times a day (BID) | ORAL | 0 refills | Status: DC
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
## 2017-07-14 NOTE — ED Notes (Signed)
Patient transported to X-ray 
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
## 2017-09-22 ENCOUNTER — Encounter (HOSPITAL_COMMUNITY): Payer: Self-pay

## 2017-09-22 ENCOUNTER — Emergency Department (HOSPITAL_COMMUNITY)
Admission: EM | Admit: 2017-09-22 | Discharge: 2017-09-22 | Disposition: A | Discharge status: Home / Self Care | Payer: Self-pay | Attending: Emergency Medicine | Admitting: Emergency Medicine

## 2017-09-22 ENCOUNTER — Other Ambulatory Visit: Payer: Self-pay

## 2017-09-22 DIAGNOSIS — H9202 Otalgia, left ear: Secondary | ICD-10-CM | POA: Insufficient documentation

## 2017-09-22 DIAGNOSIS — Z87891 Personal history of nicotine dependence: Secondary | ICD-10-CM | POA: Insufficient documentation

## 2017-09-22 DIAGNOSIS — K0889 Other specified disorders of teeth and supporting structures: Secondary | ICD-10-CM | POA: Insufficient documentation

## 2017-09-22 MED ORDER — PENICILLIN V POTASSIUM 500 MG PO TABS: 500.0000 mg | ORAL_TABLET | Freq: Three times a day (TID) | ORAL | 0 refills | Status: AC

## 2017-09-22 MED ORDER — BENZOCAINE 10 % MT GEL
Freq: Once | OROMUCOSAL | Status: AC
  Administered 2017-09-22: 10:00:00 via OROMUCOSAL
  Filled 2017-09-22: qty 9

## 2017-09-22 NOTE — ED Triage Notes (Signed)
Pt states she began having dental pain yesterday. She reports yesterday she took 800mg  ibuprofen yesterday without improvement. Pt reports ear pain as well.

## 2017-09-22 NOTE — Discharge Instructions (Addendum)
I have prescribed you antibiotics for you to take that will treat any dental infection you may have. I have also given you a list of dentists in the area who may be able to see you with your current insurance situation.  For pain, you can use Tylenol with Ibuprofen. Also applying warm compresses to your face may provide additional relief. You can buy some Oragel or over the counter dental gel for relief too.

## 2017-09-22 NOTE — ED Provider Notes (Signed)
[facility] EMERGENCY DEPARTMENT Provider Note  CSN: [id number] Arrival date & time: 09/22/17  4098   History   Chief Complaint Chief Complaint  Patient presents with  . Dental Pain    HPI Kristina Goodwin is a 27 y.o. female with no significant medical history  who presented to the ED for dental pain x1 day. Associated symptom: left otalgia. She describes constant left lower sided tooth pain that began acutely. Denies any recent mouth or tooth trauma or injury. She is not currently established with a dentist. Denies fever, chills, neck pain, dysphagia, sore throat, trismus, facial swelling or other upper respiratory symptoms. She has tried ibuprofen 800mg  x1 last night prior to coming to the ED.   Past Medical History:  Diagnosis Date  . Gonorrhea   . H/O varicella   . Hx of chlamydia infection   . Trichomonas     There are no active problems to display for this patient.   Past Surgical History:  Procedure Laterality Date  . NO PAST SURGERIES       OB History    Gravida  3   Para  3   Term  3   Preterm  0   AB  0   Living  3     SAB  0   TAB  0   Ectopic  0   Multiple  0   Live Births  3            Home Medications    Prior to Admission medications   Medication Sig Start Date End Date Taking? Authorizing Provider  amoxicillin (AMOXIL) 500 MG capsule Take 1 capsule (500 mg total) by mouth 3 (three) times daily. 03/27/16   Janne Napoleon, NP  naproxen (NAPROSYN) 500 MG tablet Take 1 tablet (500 mg total) by mouth 2 (two) times daily. 07/14/17   Khatri, Hina, PA-C  penicillin v potassium (VEETID) 500 MG tablet Take 1 tablet (500 mg total) by mouth 3 (three) times daily for 5 days. 09/22/17 09/27/17  Mortis, Sharyon Medicus, PA-C    Family History Family History  Problem Relation Age of Onset  . Asthma Father   . Asthma Brother   . Diabetes Maternal Uncle   . Hypertension Maternal Uncle   . Diabetes Maternal Grandmother   . Thyroid  disease Cousin   . Vision loss Mother        glacoma    Social History Social History   Tobacco Use  . Smoking status: Former Smoker    Packs/day: 0.25    Types: Cigarettes    Last attempt to quit: 04/18/2011    Years since quitting: 6.4  . Smokeless tobacco: Never Used  Substance Use Topics  . Alcohol use: No  . Drug use: Yes    Frequency: 5.0 times per week    Types: Marijuana    Comment: last used 3/11     Allergies   Patient has no known allergies.   Review of Systems Review of Systems  Constitutional: Negative for chills, fatigue and fever.  HENT: Positive for dental problem and ear pain. Negative for congestion, drooling, ear discharge, facial swelling, sore throat, tinnitus and trouble swallowing.   Eyes: Negative.   Respiratory: Negative.   Skin: Negative.   Neurological: Negative for headaches.    Physical Exam Updated Vital Signs BP 125/81 (BP Location: Right Arm)   Pulse 62   Temp 98.9 F (37.2 C) (Oral)   Resp 16  LMP 09/15/2017 (Within Days)   SpO2 99%   Physical Exam  Constitutional: Vital signs are normal. She appears well-developed and well-nourished. She is cooperative.  HENT:  Head: Normocephalic and atraumatic.  Right Ear: Tympanic membrane, external ear and ear canal normal.  Left Ear: Tympanic membrane, external ear and ear canal normal.  Nose: Nose normal. Right sinus exhibits no maxillary sinus tenderness and no frontal sinus tenderness. Left sinus exhibits no maxillary sinus tenderness and no frontal sinus tenderness.  Mouth/Throat: Uvula is midline, oropharynx is clear and moist and mucous membranes are normal. No oral lesions. No trismus in the jaw. Dental caries present.    Tooth #17 tender to palpation. No edema, erythema, drainage or fluctuance.  Neck: Trachea normal, normal range of motion, full passive range of motion without pain and phonation normal. Neck supple.  Lymphadenopathy:    She has no cervical adenopathy.    Neurological: She is alert.  Skin: Skin is warm. Capillary refill takes less than 2 seconds.  Nursing note and vitals reviewed.  ED Treatments / Results  Labs (all labs ordered are listed, but only abnormal results are displayed) Labs Reviewed - No data to display  EKG None  Radiology No results found.  Procedures Procedures (including critical care time)  Medications Ordered in ED Medications  benzocaine (ORAJEL) 10 % mucosal gel ( Mouth/Throat Given 09/22/17 0939)     Initial Impression / Assessment and Plan / ED Course  Triage vital signs and the nursing notes have been reviewed.  Pertinent labs & imaging results that were available during care of the patient were reviewed and considered in medical decision making (see chart for details).   Patient presents in no acute distress. She is afebrile and has normal vital signs. Patient denies systemic s/s that would suggest a widespread infection. On physical exam, there is no erythema, swelling, induration or fluctuatnce in the mouth or face that would suggest an acute dental infection or abscess. However, given the pt's poor dental hygiene, tenderness and pt's symptoms, it is possible that there is a gingival infection that cannot be visualized. Will prescribe prophylactic antibiotics and refer patient to dentist for further evaluation.  Final Clinical Impressions(s) / ED Diagnoses  1. Dental Pain. Rx for Penicillin 500mg  TID x5 days as prophylactic treatment for infection. Education provided on OTC and supportive treatment for pain. Dental resource guide given. Education on proper dental hygiene given.  Dispo: Home. After thorough clinical evaluation, this patient is determined to be medically stable and can be safely discharged with the previously mentioned treatment and/or outpatient follow-up/referral(s). At this time, there are no other apparent medical conditions that require further screening, evaluation or  treatment.   Final diagnoses:  Pain, dental    ED Discharge Orders         Ordered    penicillin v potassium (VEETID) 500 MG tablet  3 times daily     09/22/17 0954            MortisJerrel Ivory, Gabrielle I, PA-C 09/22/17 [id number]    Pricilla LovelessGoldston, Scott, MD 09/23/17 1151

## 2017-11-01 ENCOUNTER — Other Ambulatory Visit: Payer: Self-pay

## 2017-11-01 ENCOUNTER — Emergency Department (HOSPITAL_COMMUNITY)
Admission: EM | Admit: 2017-11-01 | Discharge: 2017-11-01 | Disposition: A | Discharge status: Home / Self Care | Payer: Self-pay | Attending: Emergency Medicine | Admitting: Emergency Medicine

## 2017-11-01 ENCOUNTER — Encounter (HOSPITAL_COMMUNITY): Payer: Self-pay | Admitting: *Deleted

## 2017-11-01 DIAGNOSIS — K029 Dental caries, unspecified: Secondary | ICD-10-CM

## 2017-11-01 DIAGNOSIS — K122 Cellulitis and abscess of mouth: Secondary | ICD-10-CM

## 2017-11-01 DIAGNOSIS — Z87891 Personal history of nicotine dependence: Secondary | ICD-10-CM | POA: Insufficient documentation

## 2017-11-01 MED ORDER — BENZOCAINE 20 % MT AERO
INHALATION_SPRAY | Freq: Once | OROMUCOSAL | Status: AC
  Administered 2017-11-01: 1 via OROMUCOSAL
  Filled 2017-11-01: qty 57

## 2017-11-01 MED ORDER — NAPROXEN 250 MG PO TABS
500.0000 mg | ORAL_TABLET | Freq: Once | ORAL | Status: AC
Start: 2017-11-01 — End: 2017-11-01
  Administered 2017-11-01: 500 mg via ORAL
  Filled 2017-11-01: qty 2

## 2017-11-01 MED ORDER — PENICILLIN V POTASSIUM 500 MG PO TABS: 500.0000 mg | ORAL_TABLET | Freq: Four times a day (QID) | ORAL | 0 refills | Status: AC

## 2017-11-01 MED ORDER — NAPROXEN 500 MG PO TABS: 500.0000 mg | ORAL_TABLET | Freq: Two times a day (BID) | ORAL | 0 refills | Status: DC

## 2017-11-01 NOTE — ED Provider Notes (Signed)
[facility] EMERGENCY DEPARTMENT Provider Note   CSN: [id number] Arrival date & time: 11/01/17  4098     History   Chief Complaint Chief Complaint  Patient presents with  . Dental Pain    HPI Kristina Goodwin is a 27 y.o. female presenting to the ED complaining of dental pain located left lower jaw, second to last molar. Pt states pain began Wednesday though worsened this morning.  Pain is throbbing and worse with eating.  Reports associated swelling to her mouth.  Patient has taken ibuprofen for relief of symptoms. Denies difficulty swallowing or breathing, fever, chills, N/V, or other symptoms today.  Does not have a dentist.   The history is provided by the patient.    Past Medical History:  Diagnosis Date  . Gonorrhea   . H/O varicella   . Hx of chlamydia infection   . Trichomonas     There are no active problems to display for this patient.   Past Surgical History:  Procedure Laterality Date  . NO PAST SURGERIES       OB History    Gravida  3   Para  3   Term  3   Preterm  0   AB  0   Living  3     SAB  0   TAB  0   Ectopic  0   Multiple  0   Live Births  3            Home Medications    Prior to Admission medications   Medication Sig Start Date End Date Taking? Authorizing Provider  amoxicillin (AMOXIL) 500 MG capsule Take 1 capsule (500 mg total) by mouth 3 (three) times daily. 03/27/16   Janne Napoleon, NP  naproxen (NAPROSYN) 500 MG tablet Take 1 tablet (500 mg total) by mouth 2 (two) times daily. 11/01/17   Robinson, [place] N, PA-C  penicillin v potassium (VEETID) 500 MG tablet Take 1 tablet (500 mg total) by mouth 4 (four) times daily for 7 days. 11/01/17 11/08/17  Robinson, [place] N, PA-C    Family History Family History  Problem Relation Age of Onset  . Asthma Father   . Asthma Brother   . Diabetes Maternal Uncle   . Hypertension Maternal Uncle   . Diabetes Maternal Grandmother   . Thyroid disease Cousin    . Vision loss Mother        glacoma    Social History Social History   Tobacco Use  . Smoking status: Former Smoker    Packs/day: 0.25    Types: Cigarettes    Last attempt to quit: 04/18/2011    Years since quitting: 6.5  . Smokeless tobacco: Never Used  Substance Use Topics  . Alcohol use: No  . Drug use: Yes    Frequency: 5.0 times per week    Types: Marijuana    Comment: last used 3/11     Allergies   Patient has no known allergies.   Review of Systems Review of Systems  Constitutional: Negative for fever.  HENT: Positive for dental problem and facial swelling. Negative for trouble swallowing.      Physical Exam Updated Vital Signs BP 109/73 (BP Location: Right Arm)   Pulse 86   Temp 98.6 F (37 C) (Oral)   Resp 18   LMP 10/09/2017   SpO2 100%   Physical Exam  Constitutional: She appears well-developed and well-nourished. No distress.  HENT:  Head: Normocephalic and  atraumatic.  Second to last molar on the left lower jaw with decay.  There is swelling in the buccal vestibule with fluctuance.  No sub-lingual edema or tenderness.  No swelling of the pharynx.  No trismus.  Tolerating secretions  Eyes: Conjunctivae are normal.  Neck: Normal range of motion. Neck supple.  Cardiovascular: Normal rate.  Pulmonary/Chest: Effort normal. No stridor. No respiratory distress.  Lymphadenopathy:    She has no cervical adenopathy.  Psychiatric: She has a normal mood and affect. Her behavior is normal.  Nursing note and vitals reviewed.    ED Treatments / Results  Labs (all labs ordered are listed, but only abnormal results are displayed) Labs Reviewed - No data to display  EKG None  Radiology No results found.  Procedures .Marland KitchenIncision and Drainage Date/Time: 11/01/2017 10:10 AM Performed by: Robinson, [place] N, PA-C Authorized by: Robinson, [place] N, PA-C   Consent:    Consent obtained:  Verbal   Consent given by:  Patient   Risks discussed:   Bleeding, incomplete drainage and pain   Alternatives discussed:  Alternative treatment Location:    Type:  Abscess   Location:  Mouth   Mouth location:  Vestibule of mouth Anesthesia (see MAR for exact dosages):    Anesthesia method:  Topical application   Topical anesthetic:  Lidocaine gel (benzocaine spray) Procedure type:    Complexity:  Simple Procedure details:    Incision types:  Single straight   Incision depth:  Dermal   Scalpel blade:  11   Wound management:  Irrigated with saline   Drainage:  Bloody and purulent   Drainage amount:  Scant   Wound treatment:  Wound left open   Packing materials:  None Post-procedure details:    Patient tolerance of procedure:  Tolerated well, no immediate complications   (including critical care time)  Medications Ordered in ED Medications  Benzocaine (HURRCAINE) 20 % mouth spray (has no administration in time range)  naproxen (NAPROSYN) tablet 500 mg (has no administration in time range)     Initial Impression / Assessment and Plan / ED Course  I have reviewed the triage vital signs and the nursing notes.  Pertinent labs & imaging results that were available during my care of the patient were reviewed by me and considered in my medical decision making (see chart for details).     Patient with dental caries and abscess in the vestibule left mandible. Abscess drained in the ED, tolerated well. VSS, afebrile, tolerating secretions. Exam unconcerning for peritonsillar abscess, Ludwig's angina or spread of infection.  Will treat with penicillin and pain medicine. Warm water gargles. Urged patient to follow-up with dentist. Pt safe for discharge.  Discussed results, findings, treatment and follow up. Patient advised of return precautions. Patient verbalized understanding and agreed with plan.   Final Clinical Impressions(s) / ED Diagnoses   Final diagnoses:  Pain due to dental caries  Abscess of oral space    ED Discharge Orders          Ordered    naproxen (NAPROSYN) 500 MG tablet  2 times daily     11/01/17 1008    penicillin v potassium (VEETID) 500 MG tablet  4 times daily     11/01/17 1008           Robinson, [place] N, [place] 11/01/17 1012    Maia Plan, MD 11/01/17 1949

## 2017-11-01 NOTE — Discharge Instructions (Signed)
Please read instructions below. Rinse your mouth multiple times per day with warm water. You can rinse with hydrogen peroxide after meals. Take the antibiotic, Penicillin V, 4 times per day until they are gone. You can take Naproxen up to 2 times per day with meals, as needed for pain. Schedule an appointment with a dentist, using the dental resource guide attached. Return to the ER for difficulty swallowing or breathing, fever, or new or worsening symptoms.

## 2017-11-01 NOTE — ED Triage Notes (Signed)
Pt woke up this am with swelling to lower jaw area and states that she is to have tooth removed she thinks

## 2018-02-26 ENCOUNTER — Emergency Department (HOSPITAL_COMMUNITY)
Admission: EM | Admit: 2018-02-26 | Discharge: 2018-02-26 | Disposition: A | Discharge status: Home / Self Care | Payer: Self-pay | Attending: Emergency Medicine | Admitting: Emergency Medicine

## 2018-02-26 ENCOUNTER — Other Ambulatory Visit: Payer: Self-pay

## 2018-02-26 ENCOUNTER — Encounter (HOSPITAL_COMMUNITY): Payer: Self-pay | Admitting: Emergency Medicine

## 2018-02-26 DIAGNOSIS — K047 Periapical abscess without sinus: Secondary | ICD-10-CM | POA: Insufficient documentation

## 2018-02-26 DIAGNOSIS — Z87891 Personal history of nicotine dependence: Secondary | ICD-10-CM | POA: Insufficient documentation

## 2018-02-26 DIAGNOSIS — K029 Dental caries, unspecified: Secondary | ICD-10-CM | POA: Insufficient documentation

## 2018-02-26 MED ORDER — NAPROXEN 500 MG PO TABS: 500.0000 mg | ORAL_TABLET | Freq: Two times a day (BID) | ORAL | 0 refills | Status: DC

## 2018-02-26 MED ORDER — AMOXICILLIN 500 MG PO CAPS: 500.0000 mg | ORAL_CAPSULE | Freq: Three times a day (TID) | ORAL | 0 refills | Status: DC

## 2018-02-26 MED ORDER — HYDROCODONE-ACETAMINOPHEN 5-325 MG PO TABS
1.0000 | ORAL_TABLET | Freq: Once | ORAL | Status: AC
  Administered 2018-02-26: 1 via ORAL
  Filled 2018-02-26: qty 1

## 2018-02-26 MED ORDER — AMOXICILLIN 500 MG PO CAPS
500.0000 mg | ORAL_CAPSULE | Freq: Once | ORAL | Status: AC
  Administered 2018-02-26: 500 mg via ORAL
  Filled 2018-02-26: qty 1

## 2018-02-26 NOTE — Discharge Instructions (Addendum)
Take the medication as directed. Follow up with a dentist as soon as possible. °

## 2018-02-26 NOTE — ED Provider Notes (Signed)
[facility]-EMERGENCY DEPT Provider Note   CSN: [id number] Arrival date & time: 02/26/18  1758     History   Chief Complaint Chief Complaint  Patient presents with  . Oral Swelling    HPI Kristina Goodwin is a 28 y.o. female who presents to the ED with c/o right lower dental pain with gum swelling that started 3 days ago and now has noted yellow drainage from the gum.   HPI  Past Medical History:  Diagnosis Date  . Gonorrhea   . H/O varicella   . Hx of chlamydia infection   . Trichomonas     There are no active problems to display for this patient.   Past Surgical History:  Procedure Laterality Date  . NO PAST SURGERIES       OB History    Gravida  3   Para  3   Term  3   Preterm  0   AB  0   Living  3     SAB  0   TAB  0   Ectopic  0   Multiple  0   Live Births  3            Home Medications    Prior to Admission medications   Medication Sig Start Date End Date Taking? Authorizing Provider  amoxicillin (AMOXIL) 500 MG capsule Take 1 capsule (500 mg total) by mouth 3 (three) times daily. 02/26/18   Janne Napoleon, NP  naproxen (NAPROSYN) 500 MG tablet Take 1 tablet (500 mg total) by mouth 2 (two) times daily. 02/26/18   Janne Napoleon, NP    Family History Family History  Problem Relation Age of Onset  . Asthma Father   . Asthma Brother   . Diabetes Maternal Uncle   . Hypertension Maternal Uncle   . Diabetes Maternal Grandmother   . Thyroid disease Cousin   . Vision loss Mother        glacoma    Social History Social History   Tobacco Use  . Smoking status: Former Smoker    Packs/day: 0.25    Types: Cigarettes    Last attempt to quit: 04/18/2011    Years since quitting: 6.8  . Smokeless tobacco: Never Used  Substance Use Topics  . Alcohol use: No  . Drug use: Yes    Frequency: 5.0 times per week    Types: Marijuana    Comment: last used 3/11     Allergies   Patient has no known  allergies.   Review of Systems Review of Systems  Constitutional: Negative for chills and fever.  HENT: Positive for dental problem. Negative for trouble swallowing.        Gum swelling and drainage  Gastrointestinal: Negative for nausea and vomiting.  Neurological: Negative for headaches.  Hematological: Negative for adenopathy.  Psychiatric/Behavioral: Negative for confusion.     Physical Exam Updated Vital Signs BP (!) 138/93 (BP Location: Left Arm)   Pulse 64   Temp 99.1 F (37.3 C) (Oral)   Resp 18   Ht 5\' 3"  (1.6 m)   Wt 69.9 kg   LMP 02/19/2018   SpO2 100%   BMI 27.29 kg/m   Physical Exam Vitals signs and nursing note reviewed.  Constitutional:      General: She is not in acute distress.    Appearance: She is well-developed.  HENT:     Head: Normocephalic.     Nose: Nose normal.  Mouth/Throat:     Mouth: Mucous membranes are moist.     Dentition: Dental caries and dental abscesses present.      Comments: Right lower second molar broken and decayed. Swelling of the gum surrounding the tooth with purulent drainage.  Neck:     Musculoskeletal: Neck supple.  Cardiovascular:     Rate and Rhythm: Normal rate.  Pulmonary:     Effort: Pulmonary effort is normal.  Musculoskeletal: Normal range of motion.  Skin:    General: Skin is warm and dry.  Neurological:     Mental Status: She is alert and oriented to person, place, and time.  Psychiatric:        Mood and Affect: Mood normal.      ED Treatments / Results  Labs (all labs ordered are listed, but only abnormal results are displayed) Labs Reviewed - No data to display  Radiology No results found.  Procedures Procedures (including critical care time)  Medications Ordered in ED Medications  amoxicillin (AMOXIL) capsule 500 mg (has no administration in time range)  HYDROcodone-acetaminophen (NORCO/VICODIN) 5-325 MG per tablet 1 tablet (has no administration in time range)     Initial  Impression / Assessment and Plan / ED Course  I have reviewed the triage vital signs and the nursing notes. Patient with toothache.  Small draining abscess right lower.  Exam unconcerning for Ludwig's angina or spread of infection.  Will treat with amoxicillin and anti-inflammatories medicine.  Urged patient to follow-up with dentist.  Patient agrees with plan. Final Clinical Impressions(s) / ED Diagnoses   Final diagnoses:  Dental abscess  Dental decay    ED Discharge Orders         Ordered    amoxicillin (AMOXIL) 500 MG capsule  3 times daily     02/26/18 1916    naproxen (NAPROSYN) 500 MG tablet  2 times daily     02/26/18 1916           Kerrie Buffalo [place], [place] 02/26/18 1919    Pricilla Loveless, MD 02/27/18 [phone]

## 2018-02-26 NOTE — ED Triage Notes (Signed)
Pt c/o right lower gum swelling that started on Monday and reports now having yellow pus drainage.

## 2018-09-23 ENCOUNTER — Encounter (HOSPITAL_COMMUNITY): Payer: Self-pay | Admitting: Emergency Medicine

## 2018-09-23 ENCOUNTER — Emergency Department (HOSPITAL_COMMUNITY)
Admission: EM | Admit: 2018-09-23 | Discharge: 2018-09-23 | Disposition: A | Discharge status: Home / Self Care | Payer: Self-pay | Attending: Emergency Medicine | Admitting: Emergency Medicine

## 2018-09-23 ENCOUNTER — Other Ambulatory Visit: Payer: Self-pay

## 2018-09-23 DIAGNOSIS — F1290 Cannabis use, unspecified, uncomplicated: Secondary | ICD-10-CM | POA: Insufficient documentation

## 2018-09-23 DIAGNOSIS — K047 Periapical abscess without sinus: Secondary | ICD-10-CM | POA: Insufficient documentation

## 2018-09-23 DIAGNOSIS — Z87891 Personal history of nicotine dependence: Secondary | ICD-10-CM | POA: Insufficient documentation

## 2018-09-23 MED ORDER — PENICILLIN V POTASSIUM 500 MG PO TABS: 500.0000 mg | ORAL_TABLET | Freq: Four times a day (QID) | ORAL | 0 refills | Status: AC

## 2018-09-23 MED ORDER — OXYCODONE-ACETAMINOPHEN 5-325 MG PO TABS
1.0000 | ORAL_TABLET | Freq: Once | ORAL | Status: AC
Start: 2018-09-23 — End: 2018-09-23
  Administered 2018-09-23: 12:00:00 1 via ORAL
  Filled 2018-09-23: qty 1

## 2018-09-23 NOTE — ED Triage Notes (Signed)
Pt here for evaluation of dental pain. Lower left. Pt states "dentist told her was most likely an abscess.

## 2018-09-23 NOTE — ED Notes (Signed)
Patient verbalizes understanding of discharge instructions. Opportunity for questioning and answers were provided. Armband removed by staff, pt discharged from ED.  

## 2018-09-23 NOTE — ED Provider Notes (Signed)
[facility]   CSN: [id number] Arrival date & time: 09/23/18  1019     History   Chief Complaint Chief Complaint  Patient presents with  . Dental Pain    HPI Kristina Goodwin is a 28 y.o. female.     HPI   [age] YOF presents today with complaints of dental pain.  Patient notes several day history of worsening pain in the left posterior gumline.  She denies any swelling.  She notes this is similar to previous dental infections.  She had her molars removed on the right side but did not have the molars on the left removed.  She denies any fever swelling to the neck.  She notes using ibuprofen without symptomatic improvement.  She is not pregnant or breast-feeding.  Past Medical History:  Diagnosis Date  . Gonorrhea   . H/O varicella   . Hx of chlamydia infection   . Trichomonas     There are no active problems to display for this patient.   Past Surgical History:  Procedure Laterality Date  . NO PAST SURGERIES       OB History    Gravida  3   Para  3   Term  3   Preterm  0   AB  0   Living  3     SAB  0   TAB  0   Ectopic  0   Multiple  0   Live Births  3            Home Medications    Prior to Admission medications   Medication Sig Start Date End Date Taking? Authorizing Provider  amoxicillin (AMOXIL) 500 MG capsule Take 1 capsule (500 mg total) by mouth 3 (three) times daily. 02/26/18   Janne NapoleonNeese, Hope M, NP  naproxen (NAPROSYN) 500 MG tablet Take 1 tablet (500 mg total) by mouth 2 (two) times daily. 02/26/18   Janne NapoleonNeese, Hope M, NP  penicillin v potassium (VEETID) 500 MG tablet Take 1 tablet (500 mg total) by mouth 4 (four) times daily for 7 days. 09/23/18 09/30/18  Eyvonne MechanicHedges, Fleming Prill, PA-C    Family History Family History  Problem Relation Age of Onset  . Asthma Father   . Asthma Brother   . Diabetes Maternal Uncle   . Hypertension Maternal Uncle   . Diabetes Maternal Grandmother   . Thyroid  disease Cousin   . Vision loss Mother        glacoma    Social History Social History   Tobacco Use  . Smoking status: Former Smoker    Packs/day: 0.25    Types: Cigarettes    Quit date: 04/18/2011    Years since quitting: 7.4  . Smokeless tobacco: Never Used  Substance Use Topics  . Alcohol use: No  . Drug use: Yes    Frequency: 5.0 times per week    Types: Marijuana    Comment: last used 3/11     Allergies   Patient has no known allergies.   Review of Systems Review of Systems  All other systems reviewed and are negative.   Physical Exam Updated Vital Signs BP 120/78 (BP Location: Right Arm)   Pulse 83   Temp 98.1 F (36.7 C) (Oral)   Resp 20   SpO2 99%   Physical Exam Vitals signs and nursing note reviewed.  Constitutional:      Appearance: She is well-developed.  HENT:     Head:  Normocephalic and atraumatic.     Comments: Face symmetric with no swelling or edema, tender left anterior cervical lymphadenopathy, gumline palpated with tenderness to the posterior left lateral gumline without swelling or edema, dental caries noted throughout-pharynx with no erythema Eyes:     General: No scleral icterus.       Right eye: No discharge.        Left eye: No discharge.     Conjunctiva/sclera: Conjunctivae normal.     Pupils: Pupils are equal, round, and reactive to light.  Neck:     Musculoskeletal: Normal range of motion.     Vascular: No JVD.     Trachea: No tracheal deviation.  Pulmonary:     Effort: Pulmonary effort is normal.     Breath sounds: No stridor.  Neurological:     Mental Status: She is alert and oriented to person, place, and time.     Coordination: Coordination normal.  Psychiatric:        Behavior: Behavior normal.        Thought Content: Thought content normal.        Judgment: Judgment normal.     ED Treatments / Results  Labs (all labs ordered are listed, but only abnormal results are displayed) Labs Reviewed - No data to  display  EKG None  Radiology No results found.  Procedures Procedures (including critical care time)  Medications Ordered in ED Medications  oxyCODONE-acetaminophen (PERCOCET/ROXICET) 5-325 MG per tablet 1 tablet (has no administration in time range)     Initial Impression / Assessment and Plan / ED Course  I have reviewed the triage vital signs and the nursing notes.  Pertinent labs & imaging results that were available during my care of the patient were reviewed by me and considered in my medical decision making (see chart for details).        Labs:   Imaging:  Consults:  Therapeutics:  Discharge Meds: Penicillin  Assessment/Plan: 28 year old female presents today with dental infection.  She has no obvious drainable abscess, no signs of deep space infection.  Patient will be treated with penicillin, encouraged use anti-inflammatories follow-up with dental resources.  Return precautions given.  Verbalized understanding and agreement to today's plan.   Final Clinical Impressions(s) / ED Diagnoses   Final diagnoses:  Dental infection    ED Discharge Orders         Ordered    penicillin v potassium (VEETID) 500 MG tablet  4 times daily     09/23/18 1152           Okey Regal, PA-C 09/23/18 1152    Julianne Rice, MD 09/23/18 [phone]

## 2018-09-23 NOTE — Discharge Instructions (Signed)
Please read attached information. If you experience any new or worsening signs or symptoms please return to the emergency room for evaluation. Please follow-up with your primary care provider or specialist as discussed. Please use medication prescribed only as directed and discontinue taking if you have any concerning signs or symptoms.   °

## 2019-03-24 ENCOUNTER — Inpatient Hospital Stay (HOSPITAL_COMMUNITY)
Admission: AD | Admit: 2019-03-24 | Discharge: 2019-03-24 | Disposition: A | Discharge status: Home / Self Care | Payer: Self-pay | Attending: Obstetrics and Gynecology | Admitting: Obstetrics and Gynecology

## 2019-03-24 ENCOUNTER — Inpatient Hospital Stay (HOSPITAL_COMMUNITY): Payer: Self-pay

## 2019-03-24 ENCOUNTER — Encounter (HOSPITAL_COMMUNITY): Payer: Self-pay | Admitting: Obstetrics and Gynecology

## 2019-03-24 ENCOUNTER — Other Ambulatory Visit: Payer: Self-pay

## 2019-03-24 DIAGNOSIS — O26891 Other specified pregnancy related conditions, first trimester: Secondary | ICD-10-CM | POA: Insufficient documentation

## 2019-03-24 DIAGNOSIS — O209 Hemorrhage in early pregnancy, unspecified: Secondary | ICD-10-CM | POA: Insufficient documentation

## 2019-03-24 DIAGNOSIS — O3680X0 Pregnancy with inconclusive fetal viability, not applicable or unspecified: Secondary | ICD-10-CM | POA: Insufficient documentation

## 2019-03-24 DIAGNOSIS — R1030 Lower abdominal pain, unspecified: Secondary | ICD-10-CM | POA: Insufficient documentation

## 2019-03-24 DIAGNOSIS — Z3A01 Less than 8 weeks gestation of pregnancy: Secondary | ICD-10-CM | POA: Insufficient documentation

## 2019-03-24 DIAGNOSIS — Z87891 Personal history of nicotine dependence: Secondary | ICD-10-CM | POA: Insufficient documentation

## 2019-03-24 HISTORY — DX: Other specified health status: Z78.9

## 2019-03-24 LAB — WET PREP, GENITAL
Clue Cells Wet Prep HPF POC: NONE SEEN
Sperm: NONE SEEN
Yeast Wet Prep HPF POC: NONE SEEN

## 2019-03-24 LAB — URINALYSIS, ROUTINE W REFLEX MICROSCOPIC
Bilirubin Urine: NEGATIVE
Glucose, UA: NEGATIVE mg/dL
Ketones, ur: NEGATIVE mg/dL
Nitrite: NEGATIVE
Protein, ur: NEGATIVE mg/dL
RBC / HPF: >50 RBC/hpf — ABNORMAL HIGH (ref 0–5)
Specific Gravity, Urine: 1.006 (ref 1.005–1.030)
pH: 7 (ref 5.0–8.0)

## 2019-03-24 LAB — HIV ANTIBODY (ROUTINE TESTING W REFLEX): HIV Screen 4th Generation wRfx: NONREACTIVE

## 2019-03-24 LAB — CBC
HCT: 41.4 % (ref 36.0–46.0)
Hemoglobin: 13.5 g/dL (ref 12.0–15.0)
MCH: 31.8 pg (ref 26.0–34.0)
MCHC: 32.6 g/dL (ref 30.0–36.0)
MCV: 97.6 fL (ref 80.0–100.0)
Platelets: 255 10*3/uL (ref 150–400)
RBC: 4.24 MIL/uL (ref 3.87–5.11)
RDW: 13.6 % (ref 11.5–15.5)
WBC: 7.3 10*3/uL (ref 4.0–10.5)
nRBC: 0 % (ref 0.0–0.2)

## 2019-03-24 LAB — POCT PREGNANCY, URINE: Preg Test, Ur: POSITIVE — AB

## 2019-03-24 LAB — HCG, QUANTITATIVE, PREGNANCY: hCG, Beta Chain, Quant, S: 2414 m[IU]/mL — ABNORMAL HIGH (ref <5)

## 2019-03-24 MED ORDER — METRONIDAZOLE 500 MG PO TABS
2000.0000 mg | ORAL_TABLET | Freq: Once | ORAL | Status: AC
  Administered 2019-03-24: 12:00:00 2000 mg via ORAL
  Filled 2019-03-24: qty 4

## 2019-03-24 MED ORDER — METRONIDAZOLE 500 MG PO TABS: 2000.0000 mg | ORAL_TABLET | Freq: Once | ORAL | Status: DC

## 2019-03-24 NOTE — Discharge Instructions (Signed)
Return to MAU:  If you have heavier bleeding that soaks through more that 2 pads per hour for an hour or more  If you bleed so much that you feel like you might pass out or you do pass out  If you have significant abdominal pain that is not improved with Tylenol 1000 mg  If you develop a fever > 100.5

## 2019-03-24 NOTE — MAU Note (Signed)
.   Kristina Goodwin is a 29 y.o. at [redacted]w[redacted]d here in MAU reporting: vaginal bleeding that started a week ago. Confirmed pregnancy in early February. Called pregnancy care network in [place] and was told that vaginal bleeding was normal in early pregnancy. Started having lower abdominal crampingfor 4 days.  LMP: 01/05/210 Onset of complaint:  A week ago Pain score: 6 Vitals:   03/24/19 0831  BP: 127/71  Pulse: 97  Resp: 16  Temp: 98.2 F (36.8 C)  SpO2: 100%     FHT: Lab orders placed from triage UA/UPT:

## 2019-03-24 NOTE — MAU Provider Note (Addendum)
Chief Complaint: Vaginal Bleeding and Possible Pregnancy  SUBJECTIVE HPI: Kristina Goodwin is a 29 y.o. [id number] at [redacted]w[redacted]d by LMP who presents to maternity admissions reporting vaginal bleeding and lower abdominal pain. Her vaginal bleeding began a week ago, and has been heavy per patient report, requiring 5-6 pads per day. She also reports that she passed a small blood clot two days ago. Lower abdominal pain began 4 days ago, which patient describes as intermittent and "crampy", rating it as a 6/10. She denies vaginal itching/burning, abnormal discharge, urinary symptoms, h/a, dizziness, n/v, or fever/chills.   Patient has not yet started prenatal vitamins. She denies recent intercourse, as well as tobacco or alcohol use. Per patient report, she smoked marijuana only once during pregnancy, which was last week.   Her past pregnancy history consists of a GC infection during her first pregnancy. She denies a history of preterm labor, gHTN, preeclampsia, or gDM.   HPI  Past Medical History:  Diagnosis Date   Gonorrhea    H/O varicella    Hx of chlamydia infection    Medical history non-contributory    Trichomonas    Past Surgical History:  Procedure Laterality Date   NO PAST SURGERIES     Social History   Socioeconomic History   Marital status: Single    Spouse name: Not on file   Number of children: Not on file   Years of education: Not on file   Highest education level: Not on file  Occupational History   Not on file  Tobacco Use   Smoking status: Former Smoker    Packs/day: 0.25    Types: Cigarettes    Quit date: 05/18/2018    Years since quitting: 0.8   Smokeless tobacco: Never Used  Substance and Sexual Activity   Alcohol use: No   Drug use: Yes    Frequency: 5.0 times per week    Types: Marijuana    Comment: last smoked 03/17/2019   Sexual activity: Yes    Comment: intercourse 2 months ago  Other Topics Concern   Not on file  Social History Narrative   Not on file    Social Determinants of Health   Financial Resource Strain:    Difficulty of Paying Living Expenses: Not on file  Food Insecurity:    Worried About Charity fundraiser in the Last Year: Not on file   [employer] of Food in the Last Year: Not on file  Transportation Needs:    Lack of Transportation (Medical): Not on file   Lack of Transportation (Non-Medical): Not on file  Physical Activity:    Days of Exercise per Week: Not on file   Minutes of Exercise per Session: Not on file  Stress:    Feeling of Stress : Not on file  Social Connections:    Frequency of Communication with Friends and Family: Not on file   Frequency of Social Gatherings with Friends and Family: Not on file   Attends Religious Services: Not on file   Active Member of Clubs or Organizations: Not on file   Attends Archivist Meetings: Not on file   Marital Status: Not on file  Intimate Partner Violence:    Fear of Current or Ex-Partner: Not on file   Emotionally Abused: Not on file   Physically Abused: Not on file   Sexually Abused: Not on file   No current facility-administered medications on file prior to encounter.   Current Outpatient Medications on File Prior  to Encounter  Medication Sig Dispense Refill   amoxicillin (AMOXIL) 500 MG capsule Take 1 capsule (500 mg total) by mouth 3 (three) times daily. 30 capsule 0   naproxen (NAPROSYN) 500 MG tablet Take 1 tablet (500 mg total) by mouth 2 (two) times daily. 20 tablet 0   No Known Allergies  ROS:  Review of Systems  All other systems reviewed and are negative.  I have reviewed patient's Past Medical Hx, Surgical Hx, Family Hx, Social Hx, medications and allergies.   Physical Exam   Patient Vitals for the past 24 hrs:  BP Temp Pulse Resp SpO2 Height Weight  03/24/19 0831 127/71 98.2 F (36.8 C) 97 16 100 % 5\' 3"  (1.6 m) 78.5 kg   Constitutional: Well-developed, well-nourished female in no acute distress.  Cardiovascular: normal  rate Respiratory: normal effort GI: Abd soft, non-tender. Pos BS x 4 MS: Extremities nontender, no edema, normal ROM Neurologic: Alert and oriented x 4.  GU: Neg CVAT.  LAB RESULTS Results for orders placed or performed during the hospital encounter of 03/24/19 (from the past 24 hour(s))  Pregnancy, urine POC     Status: Abnormal   Collection Time: 03/24/19  8:17 AM  Result Value Ref Range   Preg Test, Ur POSITIVE (A) NEGATIVE  Urinalysis, Routine w reflex microscopic     Status: Abnormal   Collection Time: 03/24/19  8:19 AM  Result Value Ref Range   Color, Urine YELLOW YELLOW   APPearance CLEAR CLEAR   Specific Gravity, Urine 1.006 1.005 - 1.030   pH 7.0 5.0 - 8.0   Glucose, UA NEGATIVE NEGATIVE mg/dL   Hgb urine dipstick LARGE (A) NEGATIVE   Bilirubin Urine NEGATIVE NEGATIVE   Ketones, ur NEGATIVE NEGATIVE mg/dL   Protein, ur NEGATIVE NEGATIVE mg/dL   Nitrite NEGATIVE NEGATIVE   Leukocytes,Ua TRACE (A) NEGATIVE   RBC / HPF >50 (H) 0 - 5 RBC/hpf   WBC, UA 11-20 0 - 5 WBC/hpf   Bacteria, UA FEW (A) NONE SEEN   Squamous Epithelial / LPF 0-5 0 - 5  Wet prep, genital     Status: Abnormal   Collection Time: 03/24/19  9:34 AM   Specimen: PATH Cytology Cervicovaginal Ancillary Only  Result Value Ref Range   Yeast Wet Prep HPF POC NONE SEEN NONE SEEN   Trich, Wet Prep PRESENT (A) NONE SEEN   Clue Cells Wet Prep HPF POC NONE SEEN NONE SEEN   WBC, Wet Prep HPF POC FEW (A) NONE SEEN   Sperm NONE SEEN   CBC     Status: None   Collection Time: 03/24/19 10:10 AM  Result Value Ref Range   WBC 7.3 4.0 - 10.5 K/uL   RBC 4.24 3.87 - 5.11 MIL/uL   Hemoglobin 13.5 12.0 - 15.0 g/dL   HCT 03/26/19 60.7 - 37.1 %   MCV 97.6 80.0 - 100.0 fL   MCH 31.8 26.0 - 34.0 pg   MCHC 32.6 30.0 - 36.0 g/dL   RDW 06.2 69.4 - 85.4 %   Platelets 255 150 - 400 K/uL   nRBC 0.0 0.0 - 0.2 %  hCG, quantitative, pregnancy     Status: Abnormal   Collection Time: 03/24/19 10:10 AM  Result Value Ref Range   hCG,  Beta Chain, Quant, S 2,414 (H) <5 mIU/mL       IMAGING 03/26/19 OB LESS THAN 14 WEEKS WITH OB TRANSVAGINAL  Result Date: 03/24/2019 CLINICAL DATA:  Vaginal bleeding, positive urine pregnancy test EXAM:  OBSTETRIC <14 WK [place] AND TRANSVAGINAL OB US TECHNIQUE: Both transabdominal and transvaginal ultrasound examinations were performed for complete evaluation of the gestation as well as the maternal uterus, adnexal regions, and pelvic cul-de-sac. Transvaginal technique was performed to assess early pregnancy. COMPARISON:  None. FINDINGS: Intrauterine gestational sac: None Yolk sac:  Not Visualized. Embryo:  Not Visualized. Cardiac Activity: Not Visualized. Maternal uterus/adnexae: Heterogeneous material within the lower uterine segment with possible central hypoechoic region, although no definite cystic or fluid space. No internal vascularity. Ovaries are unremarkable. No adnexal masses are seen. No free fluid in the cul-de-sac. IMPRESSION: 1. No intrauterine pregnancy identified. Heterogeneous material within the lower uterine segment without a definable gestational sac. Recommend close clinical follow-up, serial quantitative B-HCG levels, and short-term follow-up ultrasound. 2. No adnexal masses or free fluid. Electronically Signed   By: Duanne Guess D.O.   On: 03/24/2019 11:06    MAU Management/MDM: Orders Placed This Encounter  Procedures   Wet prep, genital   US OB LESS THAN 14 WEEKS WITH OB TRANSVAGINAL   Urinalysis, Routine w reflex microscopic   CBC   hCG, quantitative, pregnancy   HIV Antibody (routine testing w rflx)   Pregnancy, urine POC   Discharge patient    Meds ordered this encounter  Medications   DISCONTD: metroNIDAZOLE (FLAGYL) tablet 2,000 mg   metroNIDAZOLE (FLAGYL) tablet 2,000 mg   Kristina Goodwin is a 28 y.o. [id number] at [redacted]w[redacted]d by LMP who presents to maternity admissions reporting a week-long history of vaginal bleeding and lower abdominal pain. Presentation is most  concerning for SAB, but ectopic pregnancy and infection are also on the differential.  Diagnostic approach entails ectopic rule-out, consisting of a CBC, beta hCG, and transvaginal US; ABO/Rh not needed as patient is O pos. UA and pap smear w/ pelvic exam also done to test for infection and assess bleeding and cervical dilation.   Per midwife, Raelyn Mora, moderate bleeding was seen on pelvic exam and cervix was closed and firm. CBC and UA were noncontributory. Beta hCG of 2,414. US revealed no intrauterine pregnancy, with heterogeneous material in the lower uterine segment and no definable gestational sac, concerning for SAB. No adnexal masses or free fluid, reassuring for concerns of an ectopic, but still does not rule out the possibility.  Wet prep was positive for trichomonas. Administered 2g metronidazole for treatment. Offered a prescription for her partner, however patient cannot recall his identifying information and cannot get a hold of him over the phone.   Given her presenting symptoms and US findings, she most likely has an incomplete SAB, however ectopic pregnancy cannot be ruled out given her beta HCG level >2000.  Recommend discharge with expectant management and a f/u 48-hr hCG for formal diagnosis and rule-out of an ectopic.  ASSESSMENT 1. Pregnancy of unknown anatomic location   2. Bleeding in early pregnancy   3. Vaginal bleeding affecting early pregnancy     PLAN Discharge home with expectant management and referral for 48-hr serial hCG  Follow-up Information     [facility]. Go on 03/26/2019.   Specialty: Obstetrics and Gynecology Why: At 8:00 AM. Please arrive at 7:45 AM to get checked in. Someone will let you know if you need to wait for results or will be called for results. Contact information: [address] [place] [address] [phone]            Consuelo Pandy, [place] 03/24/2019  12:02 PM   I confirm  that I  have verified the information documented in the medical student's note and that I have also personally reperformed the history, physical exam and all medical decision making activities of this service and have verified that all service and findings are accurately documented in this student's note.    Raelyn Mora, CNM 03/24/2019 1:32 PM

## 2019-03-25 LAB — GC/CHLAMYDIA PROBE AMP (~~LOC~~) NOT AT ARMC
Chlamydia: NEGATIVE
Comment: NEGATIVE
Comment: NORMAL
Neisseria Gonorrhea: NEGATIVE

## 2019-03-26 ENCOUNTER — Other Ambulatory Visit: Payer: Self-pay

## 2019-03-26 ENCOUNTER — Telehealth: Payer: Self-pay

## 2019-03-26 DIAGNOSIS — O209 Hemorrhage in early pregnancy, unspecified: Secondary | ICD-10-CM

## 2019-03-26 LAB — BETA HCG QUANT (REF LAB): hCG Quant: 720 m[IU]/mL

## 2019-03-26 NOTE — Progress Notes (Signed)
Pt reports she is bleeding currently "like a period" she denies filling up more than 1 pad per hour. Pt reports she had some cramping last night and passed some tissue like substance. Advised pt that we will get her stat HCG level back before we close today and we will base further recommendations off of what the HCG level is. Advised pt to go to the hospital if she starts bleeding more heavily, pt voices understanding.

## 2019-03-26 NOTE — Telephone Encounter (Signed)
Called pt to inform her of HCG results. Consulted with Vonzella Nipple PA who advises that HCG level indicates miscarriage. Advised pt to go to the hospital with any heavy vaginal bleeding, severe abd pain or fever- Pt voices understanding. Pt reports some mild abd cramping and bleeding but not heavy. Advised pt to come in next week for lab only appt for non stat HCG per Raynelle Fanning, pt voices understanding and will make appt.

## 2019-03-26 NOTE — Progress Notes (Signed)
Reviewed RN note and STAT hCG results with RN. Significant drop in hCG noted consistent with SAB. RN advised to inform patient and discuss bleeding precautions and reasons to return to MAU. Otherwise will follow-up for non-stat hCG draw in 1 week and weekly until < 5.   Marny Lowenstein, PA-C 03/26/2019 11:52 AM

## 2019-04-02 ENCOUNTER — Other Ambulatory Visit: Payer: Self-pay

## 2019-04-02 DIAGNOSIS — O3680X0 Pregnancy with inconclusive fetal viability, not applicable or unspecified: Secondary | ICD-10-CM

## 2019-04-03 LAB — BETA HCG QUANT (REF LAB): hCG Quant: 65 m[IU]/mL

## 2019-04-06 ENCOUNTER — Telehealth: Payer: Self-pay

## 2019-04-06 NOTE — Telephone Encounter (Signed)
S/w patient and advised of results and need for repeat labs, advised scheduler will call for appt, pt agreed.

## 2019-04-09 ENCOUNTER — Other Ambulatory Visit: Payer: Self-pay

## 2019-04-09 DIAGNOSIS — O209 Hemorrhage in early pregnancy, unspecified: Secondary | ICD-10-CM

## 2019-04-10 LAB — BETA HCG QUANT (REF LAB): hCG Quant: 17 m[IU]/mL

## 2019-04-19 ENCOUNTER — Encounter: Payer: Self-pay | Admitting: Obstetrics & Gynecology

## 2019-04-19 ENCOUNTER — Ambulatory Visit: Payer: Self-pay | Admitting: Obstetrics & Gynecology

## 2019-04-19 NOTE — Progress Notes (Deleted)
   Patient did not show up today for her scheduled appointment.   Cashius Grandstaff, MD, FACOG Obstetrician & Gynecologist, [facility], [facility]  

## 2019-05-01 ENCOUNTER — Emergency Department (HOSPITAL_COMMUNITY)
Admission: EM | Admit: 2019-05-01 | Discharge: 2019-05-01 | Disposition: A | Discharge status: Home / Self Care | Payer: Self-pay | Attending: Emergency Medicine | Admitting: Emergency Medicine

## 2019-05-01 ENCOUNTER — Other Ambulatory Visit: Payer: Self-pay

## 2019-05-01 DIAGNOSIS — Z87891 Personal history of nicotine dependence: Secondary | ICD-10-CM | POA: Insufficient documentation

## 2019-05-01 DIAGNOSIS — K047 Periapical abscess without sinus: Secondary | ICD-10-CM | POA: Insufficient documentation

## 2019-05-01 DIAGNOSIS — K029 Dental caries, unspecified: Secondary | ICD-10-CM | POA: Insufficient documentation

## 2019-05-01 MED ORDER — PENICILLIN V POTASSIUM 500 MG PO TABS: 500.0000 mg | ORAL_TABLET | Freq: Three times a day (TID) | ORAL | 0 refills | Status: DC

## 2019-05-01 MED ORDER — PENICILLIN V POTASSIUM 250 MG PO TABS
500.0000 mg | ORAL_TABLET | Freq: Once | ORAL | Status: AC
  Administered 2019-05-01: 500 mg via ORAL
  Filled 2019-05-01: qty 2

## 2019-05-01 MED ORDER — HYDROCODONE-ACETAMINOPHEN 5-325 MG PO TABS: ORAL_TABLET | ORAL | 0 refills | Status: DC

## 2019-05-01 MED ORDER — HYDROCODONE-ACETAMINOPHEN 5-325 MG PO TABS
1.0000 | ORAL_TABLET | Freq: Once | ORAL | Status: AC
  Administered 2019-05-01: 1 via ORAL
  Filled 2019-05-01: qty 1

## 2019-05-01 NOTE — ED Triage Notes (Signed)
Pt from home with intermittent L bottom molar pain r/t a cracked tooth x 1 week.

## 2019-05-01 NOTE — ED Provider Notes (Signed)
[facility] EMERGENCY DEPARTMENT Provider Note   CSN: [id number] Arrival date & time: 05/01/19  2059     History Chief Complaint  Patient presents with  . Dental Pain    Kristina Goodwin is a 29 y.o. female.  Patient presents the emergency department with complaints of pain at tooth #18.  Patient has developed a large cavity in his tooth.  She states that over the past 1 week she has had intermittent pain in her face from this tooth.  Today the pain became worse and more persistent.  Treatments at home have not been helping.  No difficulty swallowing or breathing.  No fevers.  Patient has a dental appointment scheduled in 2 days.        Past Medical History:  Diagnosis Date  . Gonorrhea   . H/O varicella   . Hx of chlamydia infection   . Medical history non-contributory   . Trichomonas     There are no problems to display for this patient.   Past Surgical History:  Procedure Laterality Date  . NO PAST SURGERIES       OB History    Gravida  4   Para  3   Term  3   Preterm  0   AB  0   Living  3     SAB  0   TAB  0   Ectopic  0   Multiple  0   Live Births  3           Family History  Problem Relation Age of Onset  . Asthma Father   . Asthma Brother   . Diabetes Maternal Uncle   . Hypertension Maternal Uncle   . Diabetes Maternal Grandmother   . Thyroid disease Cousin   . Vision loss Mother        Glaucoma  . Seizures Mother   . Asthma Mother     Social History   Tobacco Use  . Smoking status: Former Smoker    Packs/day: 0.25    Types: Cigarettes    Quit date: 05/18/2018    Years since quitting: 0.9  . Smokeless tobacco: Never Used  Substance Use Topics  . Alcohol use: No  . Drug use: Yes    Frequency: 5.0 times per week    Types: Marijuana    Comment: last smoked 03/17/2019    Home Medications Prior to Admission medications   Medication Sig Start Date End Date Taking? Authorizing Provider    HYDROcodone-acetaminophen (NORCO/VICODIN) 5-325 MG tablet Take 0.5-1 tablet every 6 hours as needed for severe pain 05/01/19   Renne Crigler, PA-C  penicillin v potassium (VEETID) 500 MG tablet Take 1 tablet (500 mg total) by mouth 3 (three) times daily. 05/01/19   Renne Crigler, PA-C    Allergies    Patient has no known allergies.  Review of Systems   Review of Systems  Constitutional: Negative for fever.  HENT: Positive for dental problem. Negative for ear pain, facial swelling, sore throat and trouble swallowing.   Respiratory: Negative for shortness of breath and stridor.   Musculoskeletal: Negative for neck pain.  Skin: Negative for color change.  Neurological: Negative for headaches.    Physical Exam Updated Vital Signs BP 102/77 (BP Location: Right Arm)   Pulse 85   Temp 98.8 F (37.1 C) (Oral)   Resp 18   SpO2 100%   Physical Exam Vitals and nursing note reviewed.  Constitutional:  Appearance: She is well-developed.  HENT:     Head: Normocephalic and atraumatic.     Jaw: No trismus.     Right Ear: Tympanic membrane, ear canal and external ear normal.     Left Ear: Tympanic membrane, ear canal and external ear normal.     Nose: Nose normal.     Mouth/Throat:     Dentition: Abnormal dentition. Dental caries present. No dental abscesses.     Pharynx: Uvula midline. No uvula swelling.     Tonsils: No tonsillar abscesses.     Comments: Large cavity tooth #18, mild erythema of gums at that location.  Eyes:     Conjunctiva/sclera: Conjunctivae normal.  Neck:     Comments: No neck swelling or Ludwig's angina Musculoskeletal:     Cervical back: Normal range of motion and neck supple.  Lymphadenopathy:     Cervical: No cervical adenopathy.  Skin:    General: Skin is warm and dry.  Neurological:     Mental Status: She is alert.     ED Results / Procedures / Treatments   Labs (all labs ordered are listed, but only abnormal results are displayed) Labs  Reviewed - No data to display  EKG None  Radiology No results found.  Procedures Procedures (including critical care time)  Medications Ordered in ED Medications  penicillin v potassium (VEETID) tablet 500 mg (500 mg Oral Given 05/01/19 2159)  HYDROcodone-acetaminophen (NORCO/VICODIN) 5-325 MG per tablet 1 tablet (1 tablet Oral Given 05/01/19 2159)    ED Course  I have reviewed the triage vital signs and the nursing notes.  Pertinent labs & imaging results that were available during my care of the patient were reviewed by me and considered in my medical decision making (see chart for details).  10:02 PM Patient seen and examined. Medications ordered.   Vital signs reviewed and are as follows: BP 119/74   Pulse 75   Temp 97.8 F (36.6 C)   Resp 16   SpO2 100%   Patient counseled on use of narcotic pain medications. Counseled not to combine these medications with others containing tylenol. Urged not to drink alcohol, drive, or perform any other activities that requires focus while taking these medications. The patient verbalizes understanding and agrees with the plan.  Patient counseled to take prescribed medications as directed, return with worsening facial or neck swelling, and to follow-up with their dentist as soon as possible.      MDM Rules/Calculators/A&P                      Patient with toothache. No fever. Exam unconcerning for Ludwig's angina or other deep tissue infection in neck. Will treat with penicillin and pain meds.    Final Clinical Impression(s) / ED Diagnoses Final diagnoses:  Pain due to dental caries  Dental infection    Rx / DC Orders ED Discharge Orders         Ordered    HYDROcodone-acetaminophen (NORCO/VICODIN) 5-325 MG tablet     05/01/19 2148    penicillin v potassium (VEETID) 500 MG tablet  3 times daily     05/01/19 2148           Carlisle Cater, Hershal Coria 05/01/19 2203    Virgel Manifold, MD 05/02/19 2117

## 2019-05-01 NOTE — ED Notes (Signed)
Patient verbalizes understanding of discharge instructions. Opportunity for questioning and answers were provided. Armband removed by staff, pt discharged from ED ambulatory.   

## 2019-05-01 NOTE — Discharge Instructions (Signed)
Please read and follow all provided instructions.  Your diagnoses today include:  1. Pain due to dental caries   2. Dental infection    The exam and treatment you received today has been provided on an emergency basis only. This is not a substitute for complete medical or dental care.  Tests performed today include:  Vital signs. See below for your results today.   Medications prescribed:   Penicillin - antibiotic  You have been prescribed an antibiotic medicine: take the entire course of medicine even if you are feeling better. Stopping early can cause the antibiotic not to work.   Vicodin (hydrocodone/acetaminophen) - narcotic pain medication  DO NOT drive or perform any activities that require you to be awake and alert because this medicine can make you drowsy. BE VERY CAREFUL not to take multiple medicines containing Tylenol (also called acetaminophen). Doing so can lead to an overdose which can damage your liver and cause liver failure and possibly death.  Take any prescribed medications only as directed.  Home care instructions:  Follow any educational materials contained in this packet.  Follow-up instructions: Please follow-up with your dentist for further evaluation of your symptoms.   Return instructions:   Please return to the Emergency Department if you experience worsening symptoms.  Please return if you develop a fever, you develop more swelling in your face or neck, you have trouble breathing or swallowing food.  Please return if you have any other emergent concerns.  Additional Information:  Your vital signs today were: BP 102/77 (BP Location: Right Arm)    Pulse 85    Temp 98.8 F (37.1 C) (Oral)    Resp 18    SpO2 100%  If your blood pressure (BP) was elevated above 135/85 this visit, please have this repeated by your doctor within one month. --------------

## 2019-07-07 ENCOUNTER — Telehealth: Payer: Self-pay | Admitting: *Deleted

## 2019-07-07 DIAGNOSIS — O3680X0 Pregnancy with inconclusive fetal viability, not applicable or unspecified: Secondary | ICD-10-CM

## 2019-07-07 NOTE — Telephone Encounter (Signed)
Received VM from [facility] @ [facility] stating that pt was seen @ their facility for [place] today. Pt should be [redacted]w[redacted]d by LMP 05/05/19. They performed abdominal and vaginal probe [place] - Gestational sac only was visualized. Pt has not had any cramping or bleeding. Pt has history of miscarriage in February 2021.   1615  Consult w/Dr. Vergie Living who recommends pt to have repeat US in 11-14 days provided this was her first [place] for the pregnancy. Pt may have the [place] @ [facility] or Radiology @ [facility]  1620  I returned call to [facility] @ [facility] and informed her of Dr. Vergie Living' recommendations. She stated that they will rescan the pt @ their facility in 11-14 days and will contact pt with this information.

## 2019-07-27 ENCOUNTER — Other Ambulatory Visit: Payer: Self-pay

## 2019-07-27 ENCOUNTER — Inpatient Hospital Stay (HOSPITAL_COMMUNITY)
Admission: AD | Admit: 2019-07-27 | Discharge: 2019-07-27 | Disposition: A | Discharge status: Home / Self Care | Payer: Self-pay | Attending: Obstetrics and Gynecology | Admitting: Obstetrics and Gynecology

## 2019-07-27 ENCOUNTER — Encounter (HOSPITAL_COMMUNITY): Payer: Self-pay | Admitting: Obstetrics and Gynecology

## 2019-07-27 DIAGNOSIS — Z8349 Family history of other endocrine, nutritional and metabolic diseases: Secondary | ICD-10-CM | POA: Insufficient documentation

## 2019-07-27 DIAGNOSIS — Z8249 Family history of ischemic heart disease and other diseases of the circulatory system: Secondary | ICD-10-CM | POA: Insufficient documentation

## 2019-07-27 DIAGNOSIS — Z825 Family history of asthma and other chronic lower respiratory diseases: Secondary | ICD-10-CM | POA: Insufficient documentation

## 2019-07-27 DIAGNOSIS — Z3A01 Less than 8 weeks gestation of pregnancy: Secondary | ICD-10-CM | POA: Insufficient documentation

## 2019-07-27 DIAGNOSIS — Z87891 Personal history of nicotine dependence: Secondary | ICD-10-CM | POA: Insufficient documentation

## 2019-07-27 DIAGNOSIS — O219 Vomiting of pregnancy, unspecified: Secondary | ICD-10-CM | POA: Insufficient documentation

## 2019-07-27 DIAGNOSIS — Z833 Family history of diabetes mellitus: Secondary | ICD-10-CM | POA: Insufficient documentation

## 2019-07-27 LAB — URINALYSIS, ROUTINE W REFLEX MICROSCOPIC
Bilirubin Urine: NEGATIVE
Glucose, UA: NEGATIVE mg/dL
Ketones, ur: NEGATIVE mg/dL
Leukocytes,Ua: NEGATIVE
Nitrite: NEGATIVE
Protein, ur: 30 mg/dL — AB
Specific Gravity, Urine: 1.027 (ref 1.005–1.030)
pH: 6 (ref 5.0–8.0)

## 2019-07-27 MED ORDER — PROMETHAZINE HCL 25 MG PO TABS: 25.0000 mg | ORAL_TABLET | Freq: Four times a day (QID) | ORAL | 0 refills | Status: DC | PRN

## 2019-07-27 MED ORDER — PROMETHAZINE HCL 25 MG/ML IJ SOLN
25.0000 mg | Freq: Once | INTRAMUSCULAR | Status: AC
  Administered 2019-07-27: 25 mg via INTRAMUSCULAR
  Filled 2019-07-27: qty 1

## 2019-07-27 NOTE — Discharge Instructions (Signed)

## 2019-07-27 NOTE — MAU Provider Note (Signed)
Chief Complaint: Emesis and Nausea   First Provider Initiated Contact with Patient 07/27/19 1050     SUBJECTIVE HPI: Kristina Goodwin is a 29 y.o. [id number] at [redacted]w[redacted]d who presents to Maternity Admissions reporting nausea and vomiting.  Has been nauseated, but vomiting started last night.  Reports vomiting countless times per day.  Has not tried to eat or drink anything since last night.  Does not have antiemetic at home.  Denies fever, abdominal pain, diarrhea, or vaginal bleeding.  Does not have an OB/GYN yet.  Had IUP confirmed at [facility] recently.   Past Medical History:  Diagnosis Date  . Gonorrhea   . H/O varicella   . Hx of chlamydia infection   . Trichomonas    OB History  Gravida Para Term Preterm AB Living  5 3 3  0 1 3  SAB TAB Ectopic Multiple Live Births  1 0 0 0 3    # Outcome Date GA Lbr Len/2nd Weight Sex Delivery Anes PTL Lv  5 Current           4 SAB 03/2019          3 Term 11/19/11 [redacted]w[redacted]d 07:33 / 00:13 4465 g M Vag-Spont EPI  LIV     Birth Comments: Born full term, 9 pound 13 ounces, no complications during pregnancy or birth, home with mother after 2 days in hospital.  Currently not circumcised.  2 Term 2012 [redacted]w[redacted]d  3204 g F Vag-Spont EPI  LIV  1 Term 2009 [redacted]w[redacted]d  3232 g F Vag-Spont EPI  LIV   Past Surgical History:  Procedure Laterality Date  . NO PAST SURGERIES     Social History   Socioeconomic History  . Marital status: Single    Spouse name: Not on file  . Number of children: Not on file  . Years of education: Not on file  . Highest education level: Not on file  Occupational History  . Not on file  Tobacco Use  . Smoking status: Former Smoker    Packs/day: 0.25    Types: Cigarettes    Quit date: 05/18/2018    Years since quitting: 1.1  . Smokeless tobacco: Never Used  Vaping Use  . Vaping Use: Never used  Substance and Sexual Activity  . Alcohol use: No  . Drug use: Yes    Frequency: 5.0 times per week    Types: Marijuana    Comment:  last smoked 03/17/2019  . Sexual activity: Yes    Comment: intercourse 2 months ago  Other Topics Concern  . Not on file  Social History Narrative  . Not on file   Social Determinants of Health   Financial Resource Strain:   . Difficulty of Paying Living Expenses:   Food Insecurity:   . Worried About 03/19/2019 in the Last Year:   . Programme researcher, broadcasting/film/video in the Last Year:   Transportation Needs:   . Barista (Medical):   Freight forwarder Lack of Transportation (Non-Medical):   Physical Activity:   . Days of Exercise per Week:   . Minutes of Exercise per Session:   Stress:   . Feeling of Stress :   Social Connections:   . Frequency of Communication with Friends and Family:   . Frequency of Social Gatherings with Friends and Family:   . Attends Religious Services:   . Active Member of Clubs or Organizations:   . Attends Marland [employer] Meetings:   Banker Marital Status:  Intimate Partner Violence:   . Fear of Current or Ex-Partner:   . Emotionally Abused:   Marland Kitchen Physically Abused:   . Sexually Abused:    Family History  Problem Relation Age of Onset  . Asthma Father   . Asthma Brother   . Diabetes Maternal Uncle   . Hypertension Maternal Uncle   . Diabetes Maternal Grandmother   . Thyroid disease Cousin   . Vision loss Mother        Glaucoma  . Seizures Mother   . Asthma Mother    No current facility-administered medications on file prior to encounter.   Current Outpatient Medications on File Prior to Encounter  Medication Sig Dispense Refill  . HYDROcodone-acetaminophen (NORCO/VICODIN) 5-325 MG tablet Take 0.5-1 tablet every 6 hours as needed for severe pain 8 tablet 0  . penicillin v potassium (VEETID) 500 MG tablet Take 1 tablet (500 mg total) by mouth 3 (three) times daily. 21 tablet 0   No Known Allergies  I have reviewed patient's Past Medical Hx, Surgical Hx, Family Hx, Social Hx, medications and allergies.   Review of Systems  Constitutional:  Negative.   Gastrointestinal: Positive for nausea and vomiting. Negative for abdominal pain, constipation and diarrhea.  Genitourinary: Negative.     OBJECTIVE Patient Vitals for the past 24 hrs:  BP Temp Temp src Pulse Resp SpO2 Height Weight  07/27/19 1038 129/77 98.6 F (37 C) Oral 91 19 100 % -- --  07/27/19 1033 -- -- -- -- -- -- 5\' 3"  (1.6 m) 77.8 kg   Constitutional: Well-developed, well-nourished female in no acute distress.  Cardiovascular: normal rate & rhythm, no murmur Respiratory: normal rate and effort. Lung sounds clear throughout GI: Abd soft, non-tender, Pos BS x 4. No guarding or rebound tenderness MS: Extremities nontender, no edema, normal ROM Neurologic: Alert and oriented x 4.    LAB RESULTS Results for orders placed or performed during the hospital encounter of 07/27/19 (from the past 24 hour(s))  Urinalysis, Routine w reflex microscopic     Status: Abnormal   Collection Time: 07/27/19 10:44 AM  Result Value Ref Range   Color, Urine YELLOW YELLOW   APPearance HAZY (A) CLEAR   Specific Gravity, Urine 1.027 1.005 - 1.030   pH 6.0 5.0 - 8.0   Glucose, UA NEGATIVE NEGATIVE mg/dL   Hgb urine dipstick SMALL (A) NEGATIVE   Bilirubin Urine NEGATIVE NEGATIVE   Ketones, ur NEGATIVE NEGATIVE mg/dL   Protein, ur 30 (A) NEGATIVE mg/dL   Nitrite NEGATIVE NEGATIVE   Leukocytes,Ua NEGATIVE NEGATIVE   RBC / HPF 0-5 0 - 5 RBC/hpf   WBC, UA 0-5 0 - 5 WBC/hpf   Bacteria, UA FEW (A) NONE SEEN   Squamous Epithelial / LPF 11-20 0 - 5   Mucus PRESENT     IMAGING No results found.  MAU COURSE Orders Placed This Encounter  Procedures  . Urinalysis, Routine w reflex microscopic  . Discharge patient   Meds ordered this encounter  Medications  . promethazine (PHENERGAN) injection 25 mg  . promethazine (PHENERGAN) 25 MG tablet    Sig: Take 1 tablet (25 mg total) by mouth every 6 (six) hours as needed for nausea or vomiting.    Dispense:  30 tablet    Refill:  0     Order Specific Question:   Supervising Provider    Answer:   07/29/19 [place]    MDM Nausea. No vomiting while in MAU. U/a & vitals do not  indicate dehydration at this time. IM phenergan given. Pt reports improvement in symptoms & able to keep down ginger ale. Wants rx for phenergan.   ASSESSMENT 1. Nausea and vomiting during pregnancy prior to [redacted] weeks gestation   2. [redacted] weeks gestation of pregnancy     PLAN Discharge home in stable condition. Rx phenergan prn Start prenatal care   Allergies as of 07/27/2019   No Known Allergies     Medication List    STOP taking these medications   HYDROcodone-acetaminophen 5-325 MG tablet Commonly known as: NORCO/VICODIN   penicillin v potassium 500 MG tablet Commonly known as: VEETID     TAKE these medications   promethazine 25 MG tablet Commonly known as: PHENERGAN Take 1 tablet (25 mg total) by mouth every 6 (six) hours as needed for nausea or vomiting.        Judeth Horn, NP 07/27/2019  12:46 PM

## 2019-07-27 NOTE — MAU Note (Signed)
Presents with c/o N&V, states can't keep anything down,  States has been vomiting all night.  Denies VB. Reports +HPT.  LMP 06/04/2019.

## 2019-08-20 ENCOUNTER — Other Ambulatory Visit: Payer: Self-pay

## 2019-08-20 ENCOUNTER — Encounter (HOSPITAL_COMMUNITY): Payer: Self-pay | Admitting: Obstetrics & Gynecology

## 2019-08-20 ENCOUNTER — Inpatient Hospital Stay (HOSPITAL_COMMUNITY)
Admission: AD | Admit: 2019-08-20 | Discharge: 2019-08-20 | Disposition: A | Discharge status: Home / Self Care | Payer: Medicaid Other | Attending: Obstetrics & Gynecology | Admitting: Obstetrics & Gynecology

## 2019-08-20 DIAGNOSIS — O219 Vomiting of pregnancy, unspecified: Secondary | ICD-10-CM | POA: Diagnosis present

## 2019-08-20 DIAGNOSIS — Z3A11 11 weeks gestation of pregnancy: Secondary | ICD-10-CM | POA: Diagnosis not present

## 2019-08-20 DIAGNOSIS — Z87891 Personal history of nicotine dependence: Secondary | ICD-10-CM | POA: Diagnosis not present

## 2019-08-20 LAB — URINALYSIS, ROUTINE W REFLEX MICROSCOPIC
Bilirubin Urine: NEGATIVE
Glucose, UA: NEGATIVE mg/dL
Ketones, ur: 80 mg/dL — AB
Nitrite: NEGATIVE
Protein, ur: 100 mg/dL — AB
Specific Gravity, Urine: 1.033 — ABNORMAL HIGH (ref 1.005–1.030)
pH: 6 (ref 5.0–8.0)

## 2019-08-20 MED ORDER — LACTATED RINGERS IV BOLUS
1000.0000 mL | Freq: Once | INTRAVENOUS | Status: AC
  Administered 2019-08-20: 1000 mL via INTRAVENOUS

## 2019-08-20 MED ORDER — PROMETHAZINE HCL 25 MG/ML IJ SOLN
25.0000 mg | Freq: Once | INTRAMUSCULAR | Status: AC
  Administered 2019-08-20: 25 mg via INTRAVENOUS
  Filled 2019-08-20: qty 1

## 2019-08-20 MED ORDER — FAMOTIDINE IN NACL 20-0.9 MG/50ML-% IV SOLN
20.0000 mg | Freq: Once | INTRAVENOUS | Status: AC
  Administered 2019-08-20: 20 mg via INTRAVENOUS
  Filled 2019-08-20: qty 50

## 2019-08-20 MED ORDER — PROMETHAZINE HCL 25 MG PO TABS: 25.0000 mg | ORAL_TABLET | Freq: Four times a day (QID) | ORAL | 2 refills | Status: DC | PRN

## 2019-08-20 NOTE — MAU Note (Signed)
Just can't keep nothing down, has been 3 days now.  Has ran out of nausea medication. No pain.

## 2019-08-20 NOTE — Discharge Instructions (Signed)
[facility] [employer] [facility] [employer] at [facility]  [address], [place], [place] [address]  [phone]  Center for [facility] at [facility]  [address], [place], [place] [address]  [phone]  Center for [facility] at [facility] [address] [place], [place] [address]  [phone]  Center for [facility] Healthcare at [facility]  [address] [place], [place] [address]  [phone]  Center for [facility] Healthcare at [facility]  [address]), [place], [place] [address]  [phone]  Center for [facility] [address], [place], [place] [address] [phone]  Center for [facility] Healthcare at [facility]  [address] [place], [place], [place] [address]  [phone]  [facility]  [address], [place], [place] [address]  [phone]  [facility]  [address] [place], [place], [place] [address]  [phone]  [facility]  [address] [place], [place] [address]  [phone]  [facility]  [address] [place], [place] [address]  [phone]  [facility]  [address], [place], [place] [address]  [phone]  [facility]   [address] [place], [place] [address]  [phone]  [facility]  [address], [place], [place] [address]   [phone]  [facility]  [address] [place], [place] [address]  [phone]           Morning Sickness  Morning sickness is when a woman feels nauseous during pregnancy. This nauseous feeling may or may not come with vomiting. It often occurs in the morning, but it can be a problem at any time of day. Morning sickness is most common during the first trimester. In some cases, it may continue throughout pregnancy. Although morning sickness is unpleasant, it is usually harmless unless the woman  develops severe and continual vomiting (hyperemesis gravidarum), a condition that requires more intense treatment. What are the causes? The exact cause of this condition is not known, but it seems to be related to normal hormonal changes that occur in pregnancy. What increases the risk? You are more likely to develop this condition if:  You experienced nausea or vomiting before your pregnancy.  You had morning sickness during a previous pregnancy.  You are pregnant with more than one baby, such as twins. What are the signs or symptoms? Symptoms of this condition include:  Nausea.  Vomiting. How is this diagnosed? This condition is usually diagnosed based on your signs and symptoms. How is this treated? In many cases, treatment is not needed for this condition. Making some changes to what you eat may help to control symptoms. Your health care provider may also prescribe or recommend:  Vitamin B6 supplements.  Anti-nausea medicines.  Ginger. Follow these instructions at home: Medicines  Take over-the-counter and prescription medicines only as told by your health care provider. Do not use any prescription, over-the-counter, or herbal medicines for morning sickness without first talking with your health care provider.  Taking multivitamins before getting pregnant can prevent or decrease the severity of morning sickness in most women. Eating and drinking  Eat a piece of dry toast or crackers before getting out of bed in the morning.  Eat 5 or 6 small meals a day.  Eat dry and bland foods, such as rice or a baked potato. Foods  that are high in carbohydrates are often helpful.  Avoid greasy, fatty, and spicy foods.  Have someone cook for you if the smell of any food causes nausea and vomiting.  If you feel nauseous after taking prenatal vitamins, take the vitamins at night or with a snack.  Snack on protein foods between meals if you are hungry. Nuts, yogurt, and cheese are  good options.  Drink fluids throughout the day.  Try ginger ale made with real ginger, ginger tea made from fresh grated ginger, or ginger candies. General instructions  Do not use any products that contain nicotine or tobacco, such as cigarettes and e-cigarettes. If you need help quitting, ask your health care provider.  Get an air purifier to keep the air in your house free of odors.  Get plenty of fresh air.  Try to avoid odors that trigger your nausea.  Consider trying these methods to help relieve symptoms: ? Wearing an acupressure wristband. These wristbands are often worn for seasickness. ? Acupuncture. Contact a health care provider if:  Your home remedies are not working and you need medicine.  You feel dizzy or light-headed.  You are losing weight. Get help right away if:  You have persistent and uncontrolled nausea and vomiting.  You faint.  You have severe pain in your abdomen. Summary  Morning sickness is when a woman feels nauseous during pregnancy. This nauseous feeling may or may not come with vomiting.  Morning sickness is most common during the first trimester.  It often occurs in the morning, but it can be a problem at any time of day.  In many cases, treatment is not needed for this condition. Making some changes to what you eat may help to control symptoms. This information is not intended to replace advice given to you by your health care provider. Make sure you discuss any questions you have with your health care provider. Document Revised: 12/27/2016 Document Reviewed: 02/17/2016 Elsevier Patient Education  2020 [employer].

## 2019-08-20 NOTE — MAU Provider Note (Signed)
Chief Complaint: Emesis and Nausea   First Provider Initiated Contact with Patient 08/20/19 1308     SUBJECTIVE HPI: Kristina Goodwin is a 29 y.o. [id number] at [redacted]w[redacted]d who presents to Maternity Admissions reporting nausea & vomiting. Has had these symptoms throughout the pregnancy but they have worsened in the last 3 days. States she has vomited countless times today and can't keep anything down. Her symptoms were improved with phenergan but she ran out of her prescription and now her symptoms are worse. Denies abdominal pain, fever, vaginal bleeding, or diarrhea. Has not started prenatal care yet.  Past Medical History:  Diagnosis Date  . Gonorrhea   . H/O varicella   . Hx of chlamydia infection   . Trichomonas    OB History  Gravida Para Term Preterm AB Living  5 3 3  0 1 3  SAB TAB Ectopic Multiple Live Births  1 0 0 0 3    # Outcome Date GA Lbr Len/2nd Weight Sex Delivery Anes PTL Lv  5 Current           4 SAB 03/2019          3 Term 11/19/11 [redacted]w[redacted]d 07:33 / 00:13 4465 g M Vag-Spont EPI  LIV     Birth Comments: Born full term, 9 pound 13 ounces, no complications during pregnancy or birth, home with mother after 2 days in hospital.  Currently not circumcised.  2 Term 2012 [redacted]w[redacted]d  3204 g F Vag-Spont EPI  LIV  1 Term 2009 [redacted]w[redacted]d  3232 g F Vag-Spont EPI  LIV   Past Surgical History:  Procedure Laterality Date  . NO PAST SURGERIES     Social History   Socioeconomic History  . Marital status: Single    Spouse name: Not on file  . Number of children: Not on file  . Years of education: Not on file  . Highest education level: Not on file  Occupational History  . Not on file  Tobacco Use  . Smoking status: Former Smoker    Packs/day: 0.25    Types: Cigarettes    Quit date: 05/18/2018    Years since quitting: 1.2  . Smokeless tobacco: Never Used  Vaping Use  . Vaping Use: Never used  Substance and Sexual Activity  . Alcohol use: No  . Drug use: Yes    Frequency: 5.0 times per  week    Types: Marijuana    Comment: last smoked 03/17/2019  . Sexual activity: Yes    Comment: intercourse 2 months ago  Other Topics Concern  . Not on file  Social History Narrative  . Not on file   Social Determinants of Health   Financial Resource Strain:   . Difficulty of Paying Living Expenses:   Food Insecurity:   . Worried About 03/19/2019 in the Last Year:   . Programme researcher, broadcasting/film/video in the Last Year:   Transportation Needs:   . Barista (Medical):   Freight forwarder Lack of Transportation (Non-Medical):   Physical Activity:   . Days of Exercise per Week:   . Minutes of Exercise per Session:   Stress:   . Feeling of Stress :   Social Connections:   . Frequency of Communication with Friends and Family:   . Frequency of Social Gatherings with Friends and Family:   . Attends Religious Services:   . Active Member of Clubs or Organizations:   . Attends Marland [employer] Meetings:   Banker Marital  Status:   Intimate Partner Violence:   . Fear of Current or Ex-Partner:   . Emotionally Abused:   Marland Kitchen Physically Abused:   . Sexually Abused:    Family History  Problem Relation Age of Onset  . Asthma Father   . Asthma Brother   . Diabetes Maternal Uncle   . Hypertension Maternal Uncle   . Diabetes Maternal Grandmother   . Thyroid disease Cousin   . Vision loss Mother        Glaucoma  . Seizures Mother   . Asthma Mother    No current facility-administered medications on file prior to encounter.   Current Outpatient Medications on File Prior to Encounter  Medication Sig Dispense Refill  . promethazine (PHENERGAN) 25 MG tablet Take 1 tablet (25 mg total) by mouth every 6 (six) hours as needed for nausea or vomiting. 30 tablet 0   No Known Allergies  I have reviewed patient's Past Medical Hx, Surgical Hx, Family Hx, Social Hx, medications and allergies.   Review of Systems  Constitutional: Negative.   Gastrointestinal: Positive for nausea and vomiting. Negative for  abdominal pain, constipation and diarrhea.  Genitourinary: Negative.     OBJECTIVE Patient Vitals for the past 24 hrs:  BP Temp Temp src Pulse Resp SpO2 Height Weight  08/20/19 1534 (!) 135/73 98.6 F (37 C) Oral 93 18 100 % -- --  08/20/19 1216 123/80 99 F (37.2 C) Oral (!) 122 16 100 % 5\' 3"  (1.6 m) 75.9 kg   Constitutional: Well-developed, well-nourished female in no acute distress.  Cardiovascular: normal rate & rhythm, no murmur Respiratory: normal rate and effort. Lung sounds clear throughout GI: Abd soft, non-tender, Pos BS x 4. No guarding or rebound tenderness MS: Extremities nontender, no edema, normal ROM Neurologic: Alert and oriented x 4.    LAB RESULTS Results for orders placed or performed during the hospital encounter of 08/20/19 (from the past 24 hour(s))  Urinalysis, Routine w reflex microscopic     Status: Abnormal   Collection Time: 08/20/19 12:31 PM  Result Value Ref Range   Color, Urine AMBER (A) YELLOW   APPearance CLOUDY (A) CLEAR   Specific Gravity, Urine 1.033 (H) 1.005 - 1.030   pH 6.0 5.0 - 8.0   Glucose, UA NEGATIVE NEGATIVE mg/dL   Hgb urine dipstick SMALL (A) NEGATIVE   Bilirubin Urine NEGATIVE NEGATIVE   Ketones, ur 80 (A) NEGATIVE mg/dL   Protein, ur 08/22/19 (A) NEGATIVE mg/dL   Nitrite NEGATIVE NEGATIVE   Leukocytes,Ua SMALL (A) NEGATIVE   RBC / HPF 11-20 0 - 5 RBC/hpf   WBC, UA 11-20 0 - 5 WBC/hpf   Bacteria, UA RARE (A) NONE SEEN   Squamous Epithelial / LPF 21-50 0 - 5   Mucus PRESENT    Hyaline Casts, UA PRESENT     IMAGING No results found.  MAU COURSE Orders Placed This Encounter  Procedures  . Urinalysis, Routine w reflex microscopic  . Discharge patient   Meds ordered this encounter  Medications  . lactated ringers bolus 1,000 mL  . famotidine (PEPCID) IVPB 20 mg premix  . promethazine (PHENERGAN) injection 25 mg  . promethazine (PHENERGAN) 25 MG tablet    Sig: Take 1 tablet (25 mg total) by mouth every 6 (six) hours as  needed for nausea or vomiting.    Dispense:  30 tablet    Refill:  2    Order Specific Question:   Supervising Provider    Answer:  Adam Phenix [phone]    MDM Patient presents vomiting. Vitals & u/a indicate dehydration. Offered patient IV fluids which she is agreeable to.  IV LR bolus, phenergan 25 mg, & pepcid 20 mg ordered. Patient reports improvement in symptoms & able to keep down crackers. Will discharge home with phenergan prescription.    FHT present via doppler   ASSESSMENT 1. Nausea and vomiting during pregnancy prior to [redacted] weeks gestation   2. [redacted] weeks gestation of pregnancy     PLAN Discharge home in stable condition. Rx phenergan with 2 refills Start prenatal care - given list of providers Discussed reasons to return to MAU   Allergies as of 08/20/2019   No Known Allergies     Medication List    TAKE these medications   promethazine 25 MG tablet Commonly known as: PHENERGAN Take 1 tablet (25 mg total) by mouth every 6 (six) hours as needed for nausea or vomiting.        Judeth Horn, NP 08/20/2019  3:41 PM

## 2019-10-14 ENCOUNTER — Other Ambulatory Visit (HOSPITAL_COMMUNITY)
Admission: RE | Admit: 2019-10-14 | Discharge: 2019-10-14 | Disposition: A | Discharge status: Home / Self Care | Payer: Medicaid Other | Source: Ambulatory Visit | Attending: Obstetrics | Admitting: Obstetrics

## 2019-10-14 ENCOUNTER — Ambulatory Visit (INDEPENDENT_AMBULATORY_CARE_PROVIDER_SITE_OTHER): Payer: Medicaid Other | Admitting: Obstetrics

## 2019-10-14 ENCOUNTER — Encounter: Payer: Self-pay | Admitting: Obstetrics

## 2019-10-14 ENCOUNTER — Other Ambulatory Visit: Payer: Self-pay

## 2019-10-14 VITALS — BP 111/71 | HR 80 | Wt 182.1 lb

## 2019-10-14 DIAGNOSIS — Z3480 Encounter for supervision of other normal pregnancy, unspecified trimester: Secondary | ICD-10-CM | POA: Insufficient documentation

## 2019-10-14 DIAGNOSIS — Z3A18 18 weeks gestation of pregnancy: Secondary | ICD-10-CM

## 2019-10-14 DIAGNOSIS — Z3482 Encounter for supervision of other normal pregnancy, second trimester: Secondary | ICD-10-CM | POA: Diagnosis not present

## 2019-10-14 MED ORDER — BLOOD PRESSURE KIT DEVI: 1.0000 | 0 refills | Status: AC | PRN

## 2019-10-14 MED ORDER — CITRANATAL RX 27-1 MG PO TABS: 1.0000 | ORAL_TABLET | Freq: Every day | ORAL | 4 refills | Status: DC

## 2019-10-14 NOTE — Progress Notes (Signed)
Subjective:    Kristina Goodwin is being seen today for her first obstetrical visit.  This is not a planned pregnancy. She is at 63w6dgestation. Her obstetrical history is significant for none. Relationship with FOB: significant other, not living together. Patient does intend to breast feed. Pregnancy history fully reviewed.  The information documented in the HPI was reviewed and verified.  Menstrual History: OB History    Gravida  5   Para  3   Term  3   Preterm  0   AB  1   Living  3     SAB  1   TAB  0   Ectopic  0   Multiple  0   Live Births  3            Patient's last menstrual period was 06/04/2019.    Past Medical History:  Diagnosis Date  . Gonorrhea   . H/O varicella   . Hx of chlamydia infection   . Trichomonas     Past Surgical History:  Procedure Laterality Date  . NO PAST SURGERIES      (Not in a hospital admission)  No Known Allergies  Social History   Tobacco Use  . Smoking status: Passive Smoke Exposure - Never Smoker  . Smokeless tobacco: Never Used  Substance Use Topics  . Alcohol use: No    Family History  Problem Relation Age of Onset  . Asthma Father   . Asthma Brother   . Diabetes Maternal Uncle   . Hypertension Maternal Uncle   . Diabetes Maternal Grandmother   . Thyroid disease Cousin   . Vision loss Mother        Glaucoma  . Seizures Mother   . Asthma Mother      Review of Systems Constitutional: negative for weight loss Gastrointestinal: negative for vomiting Genitourinary:negative for genital lesions and vaginal discharge and dysuria Musculoskeletal:negative for back pain Behavioral/Psych: negative for abusive relationship, depression, illegal drug usage and tobacco use    Objective:    BP 111/71   Pulse 80   Wt 182 lb 1.6 oz (82.6 kg)   LMP 06/04/2019   BMI 32.26 kg/m  General Appearance:    Alert, cooperative, no distress, appears stated age  Head:    Normocephalic, without obvious abnormality,  atraumatic  Eyes:    PERRL, conjunctiva/corneas clear, EOM's intact, fundi    benign, both eyes  Ears:    Normal TM's and external ear canals, both ears  Nose:   Nares normal, septum midline, mucosa normal, no drainage    or sinus tenderness  Throat:   Lips, mucosa, and tongue normal; teeth and gums normal  Neck:   Supple, symmetrical, trachea midline, no adenopathy;    thyroid:  no enlargement/tenderness/nodules; no carotid   bruit or JVD  Back:     Symmetric, no curvature, ROM normal, no CVA tenderness  Lungs:     Clear to auscultation bilaterally, respirations unlabored  Chest Wall:    No tenderness or deformity   Heart:    Regular rate and rhythm, S1 and S2 normal, no murmur, rub   or gallop  Breast Exam:    No tenderness, masses, or nipple abnormality  Abdomen:     Soft, non-tender, bowel sounds active all four quadrants,    no masses, no organomegaly  Genitalia:    Normal female without lesion, discharge or tenderness  Extremities:   Extremities normal, atraumatic, no cyanosis or edema  Pulses:  2+ and symmetric all extremities  Skin:   Skin color, texture, turgor normal, no rashes or lesions  Lymph nodes:   Cervical, supraclavicular, and axillary nodes normal  Neurologic:   CNII-XII intact, normal strength, sensation and reflexes    throughout      Lab Review Urine pregnancy test Labs reviewed yes Radiologic studies reviewed no Assessment:    Pregnancy at 69w6dweeks    Plan:     1. Supervision of other normal pregnancy, antepartum Rx: - Enroll Patient in Babyscripts - Genetic Screening - CBC/D/Plt+RPR+Rh+ABO+Rub Ab... - Culture, OB Urine - Cervicovaginal ancillary only( [place]) - Cytology - PAP( [place]) - UKoreaMFM OB COMP + 14 WK; Future - AFP, Serum, Open Spina Bifida . Prenatal vitamins.  Counseling provided regarding continued use of seat belts, cessation of alcohol consumption, smoking or use of illicit drugs; infection precautions i.e.,  influenza/TDAP immunizations, toxoplasmosis,CMV, parvovirus, listeria and varicella; workplace safety, exercise during pregnancy; routine dental care, safe medications, sexual activity, hot tubs, saunas, pools, travel, caffeine use, fish and methlymercury, potential toxins, hair treatments, varicose veins Weight gain recommendations per IOM guidelines reviewed: underweight/BMI< 18.5--> gain 28 - 40 lbs; normal weight/BMI 18.5 - 24.9--> gain 25 - 35 lbs; overweight/BMI 25 - 29.9--> gain 15 - 25 lbs; obese/BMI >30->gain  11 - 20 lbs Problem list reviewed and updated. FIRST/CF mutation testing/NIPT/QUAD SCREEN/fragile X/Ashkenazi Jewish population testing/Spinal muscular atrophy discussed: requested. Role of ultrasound in pregnancy discussed; fetal survey: requested. Amniocentesis discussed: not indicated.  Meds ordered this encounter  Medications  . Blood Pressure Monitoring (BLOOD PRESSURE KIT) DEVI    Sig: 1 Device by Does not apply route as needed.    Dispense:  1 each    Refill:  0  . Prenat w/o A-FeCb-FeGl-DSS-FA (CITRANATAL RX) 27-1 MG TABS    Sig: Take 1 tablet by mouth daily before breakfast.    Dispense:  90 tablet    Refill:  4   Orders Placed This Encounter  Procedures  . Culture, OB Urine  . UKoreaMFM OB COMP + 14 WK    Standing Status:   Future    Standing Expiration Date:   10/13/2020    Order Specific Question:   Reason for Exam (SYMPTOM  OR DIAGNOSIS REQUIRED)    Answer:   Anatomy    Order Specific Question:   Preferred Location    Answer:   [facility]  . Genetic Screening    [place]  . CBC/D/Plt+RPR+Rh+ABO+Rub Ab...  . AFP, Serum, Open Spina Bifida    Order Specific Question:   Is patient insulin dependent?    Answer:   No    Order Specific Question:   Patient weight (lb.)    Answer:   182lb    Order Specific Question:   Gestational Age (GA), weeks    Answer:   18.6    Order Specific Question:   Date on which patient was at this [facility]   Answer:   10/14/2019     Order Specific Question:   GA Calculation Method    Answer:   LMP    Order Specific Question:   Number of fetuses    Answer:   1    Order Specific Question:   Reason for screen    Answer:   OTHER    Comments:   routine     Order Specific Question:   Donor egg?    Answer:   N    Follow up in 4  weeks - MyChart  50% of 20 min visit spent on counseling and coordination of care.    Shelly Bombard, MD 10/14/2019 10:53 AM

## 2019-10-14 NOTE — Progress Notes (Signed)
Pt presents for NOB visit c/o pelvic pain.  This is not a planned pregnancy and FOB is not living with her.  GAD7 = 5 Flu vaccine offered, pt declined

## 2019-10-15 LAB — CERVICOVAGINAL ANCILLARY ONLY
Bacterial Vaginitis (gardnerella): POSITIVE — AB
Candida Glabrata: NEGATIVE
Candida Vaginitis: NEGATIVE
Chlamydia: NEGATIVE
Comment: NEGATIVE
Comment: NEGATIVE
Comment: NEGATIVE
Comment: NEGATIVE
Comment: NEGATIVE
Comment: NORMAL
Neisseria Gonorrhea: NEGATIVE
Trichomonas: NEGATIVE

## 2019-10-16 LAB — CBC/D/PLT+RPR+RH+ABO+RUB AB...
Antibody Screen: NEGATIVE
Basophils Absolute: 0 10*3/uL (ref 0.0–0.2)
Basos: 1 %
EOS (ABSOLUTE): 0.2 10*3/uL (ref 0.0–0.4)
Eos: 2 %
HCV Ab: <0.1 s/co ratio (ref 0.0–0.9)
HIV Screen 4th Generation wRfx: NONREACTIVE
Hematocrit: 33 % — ABNORMAL LOW (ref 34.0–46.6)
Hemoglobin: 11.4 g/dL (ref 11.1–15.9)
Hepatitis B Surface Ag: NEGATIVE
Immature Grans (Abs): 0 10*3/uL (ref 0.0–0.1)
Immature Granulocytes: 1 %
Lymphocytes Absolute: 2.2 10*3/uL (ref 0.7–3.1)
Lymphs: 25 %
MCH: 32 pg (ref 26.6–33.0)
MCHC: 34.5 g/dL (ref 31.5–35.7)
MCV: 93 fL (ref 79–97)
Monocytes Absolute: 0.5 10*3/uL (ref 0.1–0.9)
Monocytes: 6 %
Neutrophils Absolute: 5.8 10*3/uL (ref 1.4–7.0)
Neutrophils: 65 %
Platelets: 252 10*3/uL (ref 150–450)
RBC: 3.56 x10E6/uL — ABNORMAL LOW (ref 3.77–5.28)
RDW: 13.6 % (ref 11.7–15.4)
RPR Ser Ql: NONREACTIVE
Rh Factor: POSITIVE
Rubella Antibodies, IGG: 6.11 index (ref >0.99)
WBC: 8.7 10*3/uL (ref 3.4–10.8)

## 2019-10-16 LAB — AFP, SERUM, OPEN SPINA BIFIDA
AFP MoM: 1.76
AFP Value: 82.7 ng/mL
Gest. Age on Collection Date: 18.6 weeks
Maternal Age At EDD: 30.7 yr
OSBR Risk 1 IN: 2814
Test Results:: NEGATIVE
Weight: 182 [lb_av]

## 2019-10-16 LAB — URINE CULTURE, OB REFLEX

## 2019-10-16 LAB — HCV INTERPRETATION

## 2019-10-16 LAB — CULTURE, OB URINE

## 2019-10-19 LAB — CYTOLOGY - PAP
Comment: NEGATIVE
Diagnosis: NEGATIVE
High risk HPV: NEGATIVE

## 2019-10-21 ENCOUNTER — Other Ambulatory Visit: Payer: Self-pay | Admitting: Obstetrics

## 2019-10-21 DIAGNOSIS — B9689 Other specified bacterial agents as the cause of diseases classified elsewhere: Secondary | ICD-10-CM

## 2019-10-21 MED ORDER — METRONIDAZOLE 500 MG PO TABS: 500.0000 mg | ORAL_TABLET | Freq: Two times a day (BID) | ORAL | 2 refills | Status: DC

## 2019-10-25 ENCOUNTER — Other Ambulatory Visit: Payer: Self-pay

## 2019-10-25 ENCOUNTER — Encounter: Payer: Self-pay | Admitting: Obstetrics

## 2019-10-25 ENCOUNTER — Ambulatory Visit: Payer: Medicaid Other | Attending: Obstetrics

## 2019-10-25 DIAGNOSIS — Z3480 Encounter for supervision of other normal pregnancy, unspecified trimester: Secondary | ICD-10-CM | POA: Diagnosis present

## 2019-11-11 ENCOUNTER — Telehealth (INDEPENDENT_AMBULATORY_CARE_PROVIDER_SITE_OTHER): Payer: Medicaid Other | Admitting: Obstetrics

## 2019-11-11 ENCOUNTER — Encounter: Payer: Self-pay | Admitting: Obstetrics

## 2019-11-11 VITALS — BP 128/78 | HR 102

## 2019-11-11 DIAGNOSIS — O219 Vomiting of pregnancy, unspecified: Secondary | ICD-10-CM

## 2019-11-11 DIAGNOSIS — Z3482 Encounter for supervision of other normal pregnancy, second trimester: Secondary | ICD-10-CM

## 2019-11-11 DIAGNOSIS — Z3A22 22 weeks gestation of pregnancy: Secondary | ICD-10-CM

## 2019-11-11 DIAGNOSIS — Z3480 Encounter for supervision of other normal pregnancy, unspecified trimester: Secondary | ICD-10-CM

## 2019-11-11 NOTE — Progress Notes (Signed)
Pt is on the phone preparing for virtual visit with provider, [redacted]w[redacted]d.

## 2019-11-11 NOTE — Progress Notes (Signed)
OBSTETRICS PRENATAL VIRTUAL VISIT ENCOUNTER NOTE  Provider location: [facility] [place]   I connected with Kristina Goodwin on 11/11/19 at 10:15 AM EDT by MyChart Video Encounter at home and verified that I am speaking with the correct person using two identifiers.   I discussed the limitations, risks, security and privacy concerns of performing an evaluation and management service virtually and the availability of in person appointments. I also discussed with the patient that there may be a patient responsible charge related to this service. The patient expressed understanding and agreed to proceed. Subjective:  Kristina Goodwin is a 29 y.o. [phone] at [redacted]w[redacted]d being seen today for ongoing prenatal care.  She is currently monitored for the following issues for this low-risk pregnancy and has Supervision of other normal pregnancy, antepartum on their problem list.  Patient reports no complaints.  Contractions: Not present. Vag. Bleeding: None.  Movement: Present. Denies any leaking of fluid.   The following portions of the patient's history were reviewed and updated as appropriate: allergies, current medications, past family history, past medical history, past social history, past surgical history and problem list.   Objective:   Vitals:   11/11/19 1013  BP: 128/78  Pulse: (!) 102    Fetal Status:     Movement: Present     General:  Alert, oriented and cooperative. Patient is in no acute distress.  Respiratory: Normal respiratory effort, no problems with respiration noted  Mental Status: Normal mood and affect. Normal behavior. Normal judgment and thought content.  Rest of physical exam deferred due to type of encounter  Imaging: [place] MFM OB COMP + 14 WK  Result Date: 10/26/2019 ----------------------------------------------------------------------  OBSTETRICS REPORT                       (Signed Final 10/26/2019 10:59 am)  ---------------------------------------------------------------------- Patient Info  ID #:       [id number]                          D.O.B.:  1990/07/19 (29 yrs)  Name:       Kristina Goodwin                Visit Date: 10/25/2019 01:51 pm ---------------------------------------------------------------------- Performed By  Attending:        Noralee Space MD        Ref. Address:     [address]                                                             [place], [place]  [id number]  Performed By:     Eden Lathearrie Stalter BS      [facility]  Referred By:      Brock BadHARLES A                    Klea Nall MD ---------------------------------------------------------------------- Orders  #  Description                           Code        Ordered By  1  US MFM OB COMP + 14 WK                76805.01    Coral CeoHARLES Brelynn Wheller ----------------------------------------------------------------------  #  Order #                     Accession #                Episode #  1  [id number]                   [id number]                 [id number] ---------------------------------------------------------------------- Indications  [redacted] weeks gestation of pregnancy                Z3A.20  Antenatal screening for malformations (neg     Z36.3  [place]izon 14, AFP) ---------------------------------------------------------------------- Fetal Evaluation  Num Of Fetuses:         1  Fetal Heart Rate(bpm):  155  Cardiac Activity:       Observed  Presentation:           Breech  Placenta:               Posterior  P. Cord Insertion:      Visualized  Amniotic Fluid  AFI FV:      Within normal limits                               Largest Pocket(cm)                              7.1 ---------------------------------------------------------------------- Biometry  BPD:        47  mm  G. Age:  20w 1d         40  %    CI:        68.38   %    70 - 86                                                          FL/HC:      18.2   %    16.8 - 19.8  HC:      181.7  mm     G. Age:  20w 4d         48  %    HC/AC:      1.11        1.09 - 1.39  AC:      164.2  mm     G. Age:  21w 3d         77  %    FL/BPD:     70.4   %  FL:       33.1  mm     G. Age:  20w 2d         39  %    FL/AC:      20.2   %    20 - 24  HUM:        33  mm     G. Age:  21w 1d         69  %  CER:      21.8  mm     G. Age:  20w 4d         71  %  LV:        6.4  mm  CM:        4.1  mm  Est. FW:     385  gm    0 lb 14 oz      72  % ---------------------------------------------------------------------- OB History  Gravidity:    5         Term:   3        Prem:   0        SAB:   1  TOP:          0       Ectopic:  0        Living: 3 ---------------------------------------------------------------------- Gestational Age  LMP:           20w 3d        Date:  06/04/19                 EDD:   03/10/20  U/S Today:     20w 4d                                        EDD:   03/09/20  Best:          Cherylann Parr 3d     Det. By:  LMP  (06/04/19)          EDD:   03/10/20 ---------------------------------------------------------------------- Anatomy  Cranium:               Appears normal         Aortic  Arch:            Appears normal  Cavum:                 Appears normal         Ductal Arch:            Appears normal  Ventricles:            Appears normal         Diaphragm:              Appears normal  Choroid Plexus:        Appears normal         Stomach:                Appears normal, left                                                                        sided  Cerebellum:            Appears normal         Abdomen:                Appears normal  Posterior Fossa:       Appears normal         Abdominal  Wall:         Appears nml (cord                                                                        insert, abd wall)  Nuchal Fold:           Appears normal         Cord Vessels:           Appears normal (3                                                                        vessel cord)  Face:                  Appears normal         Kidneys:                Appear normal                         (orbits and profile)  Lips:                  Appears normal         Bladder:                Appears normal  Thoracic:  Appears normal         Spine:                  Appears normal  Heart:                 Appears normal         Upper Extremities:      Appears normal                         (4CH, axis, and                         situs)  RVOT:                  Appears normal         Lower Extremities:      Appears normal  LVOT:                  Appears normal  Other:  Heels/feet and open hands/5th digits visualized. Nasal bone          visualized. VC, 3VV and 3VTV visualized. Fetus appears to be female. ---------------------------------------------------------------------- Cervix Uterus Adnexa  Cervix  Length:              4  cm.  Normal appearance by transabdominal scan.  Uterus  No abnormality visualized.  Right Ovary  Not visualized.  Left Ovary  Within normal limits.  Cul De Sac  No free fluid seen.  Adnexa  No abnormality visualized. ---------------------------------------------------------------------- Impression  G5 P3. Patient is here for fetal anatomy scan.  MSAFP screening showed low risk for open-neural tube  defects. No information is available on screening for fetal  aneuploidies (?resuts pending).  We performed fetal anatomy scan. No makers of  aneuploidies or fetal structural defects are seen. Fetal  biometry is consistent with her previously-established dates.  Amniotic fluid is normal and good fetal activity is seen.  Patient understands the limitations of ultrasound in detecting  fetal  anomalies. ---------------------------------------------------------------------- Recommendations  Follow-up scans as clinically indicated. ----------------------------------------------------------------------                  Noralee Space, MD Electronically Signed Final Report   10/26/2019 10:59 am ----------------------------------------------------------------------   Assessment and Plan:  Pregnancy: [id number] at [redacted]w[redacted]d There are no diagnoses linked to this encounter. Preterm labor symptoms and general obstetric precautions including but not limited to vaginal bleeding, contractions, leaking of fluid and fetal movement were reviewed in detail with the patient. I discussed the assessment and treatment plan with the patient. The patient was provided an opportunity to ask questions and all were answered. The patient agreed with the plan and demonstrated an understanding of the instructions. The patient was advised to call back or seek an in-person office evaluation/go to [facility] at [facility] for any urgent or concerning symptoms. Please refer to After Visit Summary for other counseling recommendations.   I provided 10 minutes of face-to-face time during this encounter.  Return in about 4 weeks (around 12/09/2019) for ROB, 2 hour OGTT.   Coral Ceo, MD Center for [facility], [facility] 11/11/19

## 2019-12-09 ENCOUNTER — Ambulatory Visit (INDEPENDENT_AMBULATORY_CARE_PROVIDER_SITE_OTHER): Payer: Medicaid Other | Admitting: Obstetrics and Gynecology

## 2019-12-09 ENCOUNTER — Encounter: Payer: Self-pay | Admitting: Obstetrics

## 2019-12-09 ENCOUNTER — Other Ambulatory Visit: Payer: Medicaid Other

## 2019-12-09 ENCOUNTER — Other Ambulatory Visit: Payer: Self-pay

## 2019-12-09 DIAGNOSIS — Z7189 Other specified counseling: Secondary | ICD-10-CM | POA: Diagnosis not present

## 2019-12-09 DIAGNOSIS — Z3480 Encounter for supervision of other normal pregnancy, unspecified trimester: Secondary | ICD-10-CM

## 2019-12-09 DIAGNOSIS — Z3A26 26 weeks gestation of pregnancy: Secondary | ICD-10-CM

## 2019-12-09 DIAGNOSIS — Z3482 Encounter for supervision of other normal pregnancy, second trimester: Secondary | ICD-10-CM

## 2019-12-09 NOTE — Addendum Note (Signed)
Addended by: Kennon Portela on: 12/09/2019 10:35 AM   Modules accepted: Orders

## 2019-12-09 NOTE — Progress Notes (Signed)
   PRENATAL VISIT NOTE  Subjective:  Kristina Goodwin is a 29 y.o. [phone] at [redacted]w[redacted]d being seen today for ongoing prenatal care.  She is currently monitored for the following issues for this low-risk pregnancy and has Supervision of other normal pregnancy, antepartum on their problem list.  Patient reports no complaints.  Contractions: Regular. Vag. Bleeding: None.  Movement: Present. Denies leaking of fluid.   The following portions of the patient's history were reviewed and updated as appropriate: allergies, current medications, past family history, past medical history, past social history, past surgical history and problem list.   Objective:   Vitals:   12/09/19 0947  BP: 116/71  Pulse: (!) 104  Weight: 192 lb (87.1 kg)    Fetal Status: Fetal Heart Rate (bpm): 144 Fundal Height: 27 cm Movement: Present     General:  Alert, oriented and cooperative. Patient is in no acute distress.  Skin: Skin is warm and dry. No rash noted.   Cardiovascular: Normal heart rate noted  Respiratory: Normal respiratory effort, no problems with respiration noted  Abdomen: Soft, gravid, appropriate for gestational age.  Pain/Pressure: Absent     Pelvic: Cervical exam deferred        Extremities: Normal range of motion.  Edema: None  Mental Status: Normal mood and affect. Normal behavior. Normal judgment and thought content.   Assessment and Plan:  Pregnancy: [id number] at [redacted]w[redacted]d   1. Supervision of other normal pregnancy, antepartum 2 hour GTT today Postpone flu and Tdap until next visit per patient.   COVID-19 Vaccine Counseling: The patient was counseled on the potential benefits and lack of known risks of COVID vaccination, during pregnancy and breastfeeding, during today's visit. The patient's questions and concerns were addressed today, including safety of the vaccination and potential side effects as they have been published by ACOG and SMFM. The patient has been informed that there have not been  any documented vaccine related injuries, deaths or birth defects to infant or mom after receiving the COVID-19 vaccine to date. The patient has been made aware that although she is not at increased risk of contracting COVID-19 during pregnancy, she is at increased risk of developing severe disease and complications if she contracts COVID-19 while pregnant. All patient questions were addressed during our visit today. The patient is still unsure of her decision for vaccination.    Preterm labor symptoms and general obstetric precautions including but not limited to vaginal bleeding, contractions, leaking of fluid and fetal movement were reviewed in detail with the patient. Please refer to After Visit Summary for other counseling recommendations.   Return 3-4 weeks for low risk, APP schedule.  No future appointments.  Venia Carbon, NP

## 2019-12-09 NOTE — Progress Notes (Signed)
ROB  CC having back pain, not taking prenatals. Needs to understand if taking pill am or pm

## 2019-12-10 LAB — CBC
Hematocrit: 32 % — ABNORMAL LOW (ref 34.0–46.6)
Hemoglobin: 10.6 g/dL — ABNORMAL LOW (ref 11.1–15.9)
MCH: 30.8 pg (ref 26.6–33.0)
MCHC: 33.1 g/dL (ref 31.5–35.7)
MCV: 93 fL (ref 79–97)
Platelets: 318 10*3/uL (ref 150–450)
RBC: 3.44 x10E6/uL — ABNORMAL LOW (ref 3.77–5.28)
RDW: 12.9 % (ref 11.7–15.4)
WBC: 9 10*3/uL (ref 3.4–10.8)

## 2019-12-10 LAB — RPR: RPR Ser Ql: NONREACTIVE

## 2019-12-10 LAB — GLUCOSE TOLERANCE, 2 HOURS W/ 1HR
Glucose, 1 hour: 102 mg/dL (ref 65–179)
Glucose, 2 hour: 76 mg/dL (ref 65–152)
Glucose, Fasting: 66 mg/dL (ref 65–91)

## 2019-12-10 LAB — HIV ANTIBODY (ROUTINE TESTING W REFLEX): HIV Screen 4th Generation wRfx: NONREACTIVE

## 2020-01-06 ENCOUNTER — Other Ambulatory Visit: Payer: Self-pay

## 2020-01-06 ENCOUNTER — Ambulatory Visit (INDEPENDENT_AMBULATORY_CARE_PROVIDER_SITE_OTHER): Payer: Medicaid Other | Admitting: Nurse Practitioner

## 2020-01-06 ENCOUNTER — Encounter: Payer: Self-pay | Admitting: Nurse Practitioner

## 2020-01-06 VITALS — BP 121/76 | HR 110 | Wt 197.0 lb

## 2020-01-06 DIAGNOSIS — Z23 Encounter for immunization: Secondary | ICD-10-CM | POA: Diagnosis not present

## 2020-01-06 DIAGNOSIS — M5441 Lumbago with sciatica, right side: Secondary | ICD-10-CM

## 2020-01-06 DIAGNOSIS — Z3483 Encounter for supervision of other normal pregnancy, third trimester: Secondary | ICD-10-CM | POA: Diagnosis not present

## 2020-01-06 DIAGNOSIS — Z3A30 30 weeks gestation of pregnancy: Secondary | ICD-10-CM

## 2020-01-06 DIAGNOSIS — Z3480 Encounter for supervision of other normal pregnancy, unspecified trimester: Secondary | ICD-10-CM

## 2020-01-06 NOTE — Progress Notes (Signed)
Pt presents for ROB c/o low back pain TDap and Flu vaccines given today

## 2020-01-06 NOTE — Progress Notes (Signed)
    Subjective:  SAFIRE GORDIN is a 29 y.o. [phone] at [redacted]w[redacted]d being seen today for ongoing prenatal care.  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  Patient reports back pain and pain in back of her right leg.  Contractions: Not present. Vag. Bleeding: None.  Movement: Present. Denies leaking of fluid.   The following portions of the patient's history were reviewed and updated as appropriate: allergies, current medications, past family history, past medical history, past social history, past surgical history and problem list. Problem list updated.  Objective:   Vitals:   01/06/20 1117  BP: 121/76  Pulse: (!) 110  Weight: 197 lb (89.4 kg)    Fetal Status: Fetal Heart Rate (bpm): 140    Movement: Present     General:  Alert, oriented and cooperative. Patient is in no acute distress.  Skin: Skin is warm and dry. No rash noted.   Cardiovascular: Normal heart rate noted  Respiratory: Normal respiratory effort, no problems with respiration noted  Abdomen: Soft, gravid, appropriate for gestational age. Pain/Pressure: Absent     Pelvic:  Cervical exam deferred        Extremities: Normal range of motion.  Edema: None  Mental Status: Normal mood and affect. Normal behavior. Normal judgment and thought content.   Urinalysis:      Assessment and Plan:  Pregnancy: [id number] at [redacted]w[redacted]d  1. Supervision of other normal pregnancy, antepartum Does not plan to get Covid vaccine - discussed but client does not want vaccine.  Her father died of Covid in first wave.  Her mother advises her not to get the vaccine.  Given info on the vaccine including research time, recommendations for vaccine in pregnancy and expected side effects - still does not plan to get the vaccine  2. 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 Please refer to After Visit Summary for other counseling recommendations.  No follow-ups on file.at the time the chart was signed.  Message sent to admin staff to schedule her next appointment.  Left at 12:15 pm  Nolene Bernheim, RN, MSN, NP-BC Nurse Practitioner, [facility] for [employer], [facility] 01/06/2020 12:31 PM

## 2020-01-19 ENCOUNTER — Encounter: Payer: Self-pay | Admitting: Obstetrics

## 2020-01-20 ENCOUNTER — Other Ambulatory Visit: Payer: Self-pay

## 2020-01-20 ENCOUNTER — Ambulatory Visit (INDEPENDENT_AMBULATORY_CARE_PROVIDER_SITE_OTHER): Payer: Medicaid Other | Admitting: Nurse Practitioner

## 2020-01-20 VITALS — BP 130/82 | HR 117 | Wt 193.6 lb

## 2020-01-20 DIAGNOSIS — Z3A33 33 weeks gestation of pregnancy: Secondary | ICD-10-CM

## 2020-01-20 DIAGNOSIS — Z3480 Encounter for supervision of other normal pregnancy, unspecified trimester: Secondary | ICD-10-CM

## 2020-01-20 NOTE — Progress Notes (Signed)
Pt reports fetal movement, denies pain.  

## 2020-01-20 NOTE — Progress Notes (Signed)
    Subjective:  Kristina Goodwin is a 29 y.o. [phone] at [redacted]w[redacted]d being seen today for ongoing prenatal care.  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  Patient reports no complaints.  Contractions: Not present. Vag. Bleeding: None.  Movement: Present. Denies leaking of fluid.   The following portions of the patient's history were reviewed and updated as appropriate: allergies, current medications, past family history, past medical history, past social history, past surgical history and problem list. Problem list updated.  Objective:   Vitals:   01/20/20 1352  BP: 130/82  Pulse: (!) 117  Weight: 193 lb 9.6 oz (87.8 kg)    Fetal Status: Fetal Heart Rate (bpm): 138 Fundal Height: 34 cm Movement: Present     General:  Alert, oriented and cooperative. Patient is in no acute distress.  Skin: Skin is warm and dry. No rash noted.   Cardiovascular: Normal heart rate noted  Respiratory: Normal respiratory effort, no problems with respiration noted  Abdomen: Soft, gravid, appropriate for gestational age. Pain/Pressure: Absent     Pelvic:  Cervical exam deferred        Extremities: Normal range of motion.  Edema: None  Mental Status: Normal mood and affect. Normal behavior. Normal judgment and thought content.   Urinalysis:      Assessment and Plan:  Pregnancy: [id number] at [redacted]w[redacted]d  1. Supervision of other normal pregnancy, antepartum BP slightly higher today but in normal range Reviewed symptoms of high blood pressure - edema, headache, RUQ pain and blurred vision Reviewed taking BP weekly and adding to the Babyscripts app  Preterm labor symptoms and general obstetric precautions including but not limited to vaginal bleeding, contractions, leaking of fluid and fetal movement were reviewed in detail with the patient. Please  refer to After Visit Summary for other counseling recommendations.  Return in about 2 weeks (around 02/03/2020) for in person ROB.  Nolene Bernheim, RN, MSN, NP-BC Nurse Practitioner, [facility] for [employer], [facility] 01/20/2020 2:22 PM

## 2020-01-29 NOTE — L&D Delivery Note (Signed)
OB/GYN Faculty Practice Delivery Note  Kristina Goodwin is a 30 y.o. [id number] s/p vaginal delivery at [redacted]w[redacted]d. She was admitted for post-dates IOL.  ROM: 4h 64m with clear fluid GBS Status: positive; s/p adequate antibiotics prior to delivery Maximum Maternal Temperature: 99.40F  Labor Progress: On admission, pt received cytotec x2 and FB was placed. She was then transitioned to pitocin at 1510 on 2/12 with AROM for clear fluid at 1615. She then progressed to complete cervical dilation and had an uncomplicated delivery as noted below.  Delivery Date/Time: 03/11/20 at 2042 Delivery: Called to room and patient was complete and pushing. Head delivered ROA. No nuchal cord present. Shoulder and body delivered in usual fashion. Infant with spontaneous cry, placed on mother's abdomen, dried and stimulated. Cord clamped x 2 after 1-minute delay, and cut by FOB under my direct supervision. Cord blood drawn. Placenta delivered spontaneously with gentle cord traction. Fundus firm with massage and Pitocin. Labia, perineum, vagina, and cervix were inspected, notable for bilateral periurethral lacerations.  Placenta: 3-vessel cord, intact, sent to L&D Complications: none Lacerations: bilateral periurethral lacerations (repair of left with use of 4-0 vicryl in standard fashion but deferred repair of right given hemostatic) EBL: 25 ml Analgesia: epidural  Infant: viable female  APGARs 9 & 10  3714g  Lynnda Shields, MD OB/GYN Fellow, Faculty Practice

## 2020-02-04 ENCOUNTER — Other Ambulatory Visit: Payer: Self-pay

## 2020-02-04 ENCOUNTER — Encounter: Payer: Self-pay | Admitting: Obstetrics and Gynecology

## 2020-02-04 ENCOUNTER — Ambulatory Visit (INDEPENDENT_AMBULATORY_CARE_PROVIDER_SITE_OTHER): Payer: Medicaid Other | Admitting: Obstetrics and Gynecology

## 2020-02-04 VITALS — BP 131/83 | HR 112 | Wt 199.7 lb

## 2020-02-04 DIAGNOSIS — Z3480 Encounter for supervision of other normal pregnancy, unspecified trimester: Secondary | ICD-10-CM

## 2020-02-04 NOTE — Progress Notes (Signed)
   PRENATAL VISIT NOTE  Subjective:  Kristina Goodwin is a 30 y.o. [phone] at [redacted]w[redacted]d being seen today for ongoing prenatal care.  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  Patient reports no complaints.  Contractions: Not present. Vag. Bleeding: None.  Movement: Present. Denies leaking of fluid.   The following portions of the patient's history were reviewed and updated as appropriate: allergies, current medications, past family history, past medical history, past social history, past surgical history and problem list.   Objective:   Vitals:   02/04/20 1037  BP: 131/83  Pulse: (!) 112  Weight: 199 lb 11.2 oz (90.6 kg)    Fetal Status: Fetal Heart Rate (bpm): 135 Fundal Height: 35 cm Movement: Present     General:  Alert, oriented and cooperative. Patient is in no acute distress.  Skin: Skin is warm and dry. No rash noted.   Cardiovascular: Normal heart rate noted  Respiratory: Normal respiratory effort, no problems with respiration noted  Abdomen: Soft, gravid, appropriate for gestational age.  Pain/Pressure: Absent     Pelvic: Cervical exam deferred        Extremities: Normal range of motion.  Edema: None  Mental Status: Normal mood and affect. Normal behavior. Normal judgment and thought content.   Assessment and Plan:  Pregnancy: [id number] at [redacted]w[redacted]d 1. Supervision of other normal pregnancy, antepartum Patient is doing well without complaints Cultures next visit Patient is considering BTL- form signed today  Preterm labor symptoms and general obstetric precautions including but not limited to vaginal bleeding, contractions, leaking of fluid and fetal movement were reviewed in detail with the patient. Please refer to After Visit Summary for other counseling recommendations.   Return in about 1 week (around  02/11/2020) for in person, ROB, Low risk.  No future appointments.  Catalina Antigua, MD

## 2020-02-04 NOTE — Progress Notes (Signed)
Pt reports fetal movement, denies pain.  

## 2020-02-11 ENCOUNTER — Other Ambulatory Visit: Payer: Self-pay

## 2020-02-11 ENCOUNTER — Other Ambulatory Visit (HOSPITAL_COMMUNITY)
Admission: RE | Admit: 2020-02-11 | Discharge: 2020-02-11 | Disposition: A | Discharge status: Home / Self Care | Payer: Medicaid Other | Source: Ambulatory Visit | Attending: Nurse Practitioner | Admitting: Nurse Practitioner

## 2020-02-11 ENCOUNTER — Encounter: Payer: Self-pay | Admitting: Nurse Practitioner

## 2020-02-11 ENCOUNTER — Ambulatory Visit (INDEPENDENT_AMBULATORY_CARE_PROVIDER_SITE_OTHER): Payer: Medicaid Other | Admitting: Nurse Practitioner

## 2020-02-11 VITALS — BP 123/80 | HR 113 | Wt 198.0 lb

## 2020-02-11 DIAGNOSIS — Z3A36 36 weeks gestation of pregnancy: Secondary | ICD-10-CM

## 2020-02-11 DIAGNOSIS — Z3480 Encounter for supervision of other normal pregnancy, unspecified trimester: Secondary | ICD-10-CM | POA: Diagnosis present

## 2020-02-11 NOTE — Patient Instructions (Signed)

## 2020-02-11 NOTE — Progress Notes (Signed)
ROB/GBS, reports no problems today. 

## 2020-02-11 NOTE — Progress Notes (Signed)
    Subjective:  Kristina Goodwin is a 30 y.o. [phone] at [redacted]w[redacted]d being seen today for ongoing prenatal care.  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  Patient reports no complaints.  Contractions: Not present. Vag. Bleeding: None.  Movement: Present. Denies leaking of fluid.   The following portions of the patient's history were reviewed and updated as appropriate: allergies, current medications, past family history, past medical history, past social history, past surgical history and problem list. Problem list updated.  Objective:   Vitals:   02/11/20 1014  BP: 123/80  Pulse: (!) 113  Weight: 198 lb (89.8 kg)    Fetal Status: Fetal Heart Rate (bpm): 138 Fundal Height: 36 cm Movement: Present  Presentation: Vertex  General:  Alert, oriented and cooperative. Patient is in no acute distress.  Skin: Skin is warm and dry. No rash noted.   Cardiovascular: Normal heart rate noted  Respiratory: Normal respiratory effort, no problems with respiration noted  Abdomen: Soft, gravid, appropriate for gestational age. Pain/Pressure: Present     Pelvic:  Cervical exam performed Dilation: Fingertip Effacement (%): Thick Station: -3  Extremities: Normal range of motion.  Edema: None  Mental Status: Normal mood and affect. Normal behavior. Normal judgment and thought content.   Urinalysis:      Assessment and Plan:  Pregnancy: [id number] at [redacted]w[redacted]d  1. Supervision of other normal pregnancy, antepartum Does not plan to get Covid vaccine  - Strep Gp B NAA - Cervicovaginal ancillary only( [place])    Term labor symptoms and general obstetric precautions including but not limited to vaginal bleeding, contractions, leaking of fluid and fetal movement were reviewed in detail with the patient. Please refer to After Visit Summary  for other counseling recommendations.  Return in about 1 week (around 02/18/2020) for virtual ROB.  Nolene Bernheim, RN, MSN, NP-BC Nurse Practitioner, [facility] for [employer], [facility] 02/11/2020 10:27 AM

## 2020-02-14 ENCOUNTER — Encounter: Payer: Self-pay | Admitting: Nurse Practitioner

## 2020-02-14 DIAGNOSIS — O99820 Streptococcus B carrier state complicating pregnancy: Secondary | ICD-10-CM | POA: Insufficient documentation

## 2020-02-14 LAB — STREP GP B NAA: Strep Gp B NAA: POSITIVE — AB

## 2020-02-14 LAB — CERVICOVAGINAL ANCILLARY ONLY
Chlamydia: NEGATIVE
Comment: NEGATIVE
Comment: NORMAL
Neisseria Gonorrhea: NEGATIVE

## 2020-02-18 ENCOUNTER — Telehealth (INDEPENDENT_AMBULATORY_CARE_PROVIDER_SITE_OTHER): Payer: Medicaid Other | Admitting: Obstetrics & Gynecology

## 2020-02-18 VITALS — BP 128/80 | HR 91

## 2020-02-18 DIAGNOSIS — Z3A37 37 weeks gestation of pregnancy: Secondary | ICD-10-CM

## 2020-02-18 DIAGNOSIS — Z3480 Encounter for supervision of other normal pregnancy, unspecified trimester: Secondary | ICD-10-CM

## 2020-02-18 DIAGNOSIS — O99820 Streptococcus B carrier state complicating pregnancy: Secondary | ICD-10-CM

## 2020-02-18 NOTE — Patient Instructions (Signed)
Postpartum Tubal Ligation Postpartum tubal ligation (PPTL) is a procedure to close the fallopian tubes. This is done to prevent pregnancy. When the fallopian tubes are closed, the eggs that the ovaries release cannot enter the uterus, and sperm cannot reach the eggs. PPTL is done right after childbirth or 1-2 days after childbirth, before the uterus returns to its normal position. If you have a cesarean section, it can be performed at the same time as the procedure. Having this done after childbirth does not make your stay in the hospital longer. PPTL is sometimes called "getting your tubes tied." You should not have this procedure if you want to get pregnant again or if you are unsure about having more children. Tell a health care provider about:  Any allergies you have.  All medicines you are taking, including vitamins, herbs, eye drops, creams, and over-the-counter medicines.  Any problems you or family members have had with anesthetic medicines.  Any blood disorders you have.  Any surgeries you have had.  Any medical conditions you have or have had.  Any past pregnancies. What are the risks? Generally, this is a safe procedure. However, problems may occur, including:  Infection.  Bleeding.  Damage to other organs in the abdomen.  Side effects from anesthetic medicines.  Failure of the procedure. If this happens, you could get pregnant.  Ectopic pregnancy. This is a pregnancy in which the egg attaches outside the uterus. What happens before the procedure? Ask your health care provider about:  How much pain you can expect to have.  What medicines you will be given for pain, especially if you are breastfeeding. What happens during the procedure? If you had a vaginal delivery:  An IV will be inserted into one of your veins.  You will be given one or more of the following: ? A medicine to help you relax (sedative). ? A medicine to numb the area (local anesthetic). ? A  medicine to make you fall asleep (general anesthetic). ? A medicine that is injected into an area of your body to numb everything below the injection site (regional anesthetic).  If you have been given a general anesthetic, a tube will be put down your throat to help you breathe.  Your bladder may be emptied with a small tube (catheter).  An incision will be made just below your belly button.  Your fallopian tubes will be located and brought up through the incision.  Your fallopian tubes will be tied off, burned (cauterized), or blocked with a clip, ring, or clamp. A small part in the center of each fallopian tube may be removed.  The incision will be closed with stitches (sutures).  A bandage (dressing) will be placed over the incision. If you had a cesarean delivery:  Tubal ligation will be done through the incision that was used for the cesarean delivery of your baby.  The incision will be closed with sutures.  A dressing will be placed over the incision. The procedure may vary among health care providers and hospitals.   What happens after the procedure?  Your blood pressure, heart rate, breathing rate, and blood oxygen level will be monitored until you leave the hospital.  You will be given pain medicine as needed.  You will be encouraged to get up early and walk to prevent blood clots.  If you were given a sedative during the procedure, it can affect you for several hours. Do not drive or operate machinery until your health care provider says  that it is safe. Summary  Postpartum tubal ligation is a procedure that closes the fallopian tubes to prevent pregnancy.  This procedure is done while you are still in the hospital after childbirth. If you have a cesarean section, it can be performed at the same time.  Having this done after childbirth does not make your stay in the hospital longer.  Postpartum tubal ligation is considered permanent. You should not have this  procedure if you want to get pregnant again or if you are unsure about having more children.  Talk to your health care provider to see if this procedure is right for you. This information is not intended to replace advice given to you by your health care provider. Make sure you discuss any questions you have with your health care provider. Document Revised: 10/01/2019 Document Reviewed: 10/01/2019 Elsevier Patient Education  2021 [employer].

## 2020-02-18 NOTE — Progress Notes (Signed)
    TELEHEALTH OBSTETRICS VISIT ENCOUNTER NOTE  Provider location: Center for [employer] at [place]   I connected with Kristina Goodwin on 02/18/20 at  8:15 AM EST by telephone at home and verified that I am speaking with the correct person using two identifiers. Of note, unable to do video encounter due to technical difficulties.    I discussed the limitations, risks, security and privacy concerns of performing an evaluation and management service by telephone and the availability of in person appointments. I also discussed with the patient that there may be a patient responsible charge related to this service. The patient expressed understanding and agreed to proceed.  Subjective:  Kristina Goodwin is a 30 y.o. [phone] at [redacted]w[redacted]d being followed for ongoing prenatal care.  She is currently monitored for the following issues for this low-risk pregnancy and has Supervision of other normal pregnancy, antepartum; Midline low back pain with right-sided sciatica; and Group B Streptococcus carrier, +RV culture, currently pregnant on their problem list.  Patient reports no complaints. Reports fetal movement. Denies any contractions, bleeding or leaking of fluid.   The following portions of the patient's history were reviewed and updated as appropriate: allergies, current medications, past family history, past medical history, past social history, past surgical history and problem list.   Objective:  Blood pressure 128/80, pulse 91, last menstrual period 05/31/2019, unknown if currently breastfeeding. General:  Alert, oriented and cooperative.   Mental Status: Normal mood and affect perceived. Normal judgment and thought content.  Rest of physical exam deferred due to type of encounter  Assessment and Plan:  Pregnancy: [id number] at [redacted]w[redacted]d 1. Group B Streptococcus carrier, +RV culture, currently pregnant Treat with Abx in labor  2. Supervision of other normal pregnancy, antepartum Plans BTL,   Risks and benefits of procedure discussed with patient including permanence of method, bleeding, infection, injury to surrounding organs and need for additional procedures. Risk failure of 0.5-1% with increased risk of ectopic gestation if pregnancy occurs was also discussed with patient. .     Term labor symptoms and general obstetric precautions including but not limited to vaginal bleeding, contractions, leaking of fluid and fetal movement were reviewed in detail with the patient.  I discussed the assessment and treatment plan with the patient. The patient was provided an opportunity to ask questions and all were answered. The patient agreed with the plan and demonstrated an understanding of the instructions. The patient was advised to call back or seek an in-person office evaluation/go to [facility] at [facility] for any urgent or concerning symptoms. Please refer to After Visit Summary for other counseling recommendations.   I provided 10 minutes of non-face-to-face time during this encounter.  Return in about 1 week (around 02/25/2020).  No future appointments.  Scheryl Darter, MD Center for [facility], [facility]

## 2020-02-25 ENCOUNTER — Other Ambulatory Visit: Payer: Self-pay

## 2020-02-25 ENCOUNTER — Ambulatory Visit (INDEPENDENT_AMBULATORY_CARE_PROVIDER_SITE_OTHER): Payer: Medicaid Other | Admitting: Obstetrics and Gynecology

## 2020-02-25 ENCOUNTER — Encounter: Payer: Self-pay | Admitting: Obstetrics and Gynecology

## 2020-02-25 VITALS — BP 130/85 | HR 103 | Wt 204.0 lb

## 2020-02-25 DIAGNOSIS — O99820 Streptococcus B carrier state complicating pregnancy: Secondary | ICD-10-CM

## 2020-02-25 DIAGNOSIS — Z3480 Encounter for supervision of other normal pregnancy, unspecified trimester: Secondary | ICD-10-CM

## 2020-02-25 NOTE — Progress Notes (Signed)
   PRENATAL VISIT NOTE  Subjective:  Kristina Goodwin is a 30 y.o. [phone] at [redacted]w[redacted]d being seen today for ongoing prenatal care.  She is currently monitored for the following issues for this low-risk pregnancy and has Supervision of other normal pregnancy, antepartum; Midline low back pain with right-sided sciatica; and Group B Streptococcus carrier, +RV culture, currently pregnant on their problem list.  Patient reports no complaints.  Contractions: Irritability. Vag. Bleeding: None.  Movement: Present. Denies leaking of fluid.   The following portions of the patient's history were reviewed and updated as appropriate: allergies, current medications, past family history, past medical history, past social history, past surgical history and problem list.   Objective:   Vitals:   02/25/20 1050  BP: 130/85  Pulse: (!) 103  Weight: 204 lb (92.5 kg)    Fetal Status: Fetal Heart Rate (bpm): 144 Fundal Height: 38 cm Movement: Present  Presentation: Vertex  General:  Alert, oriented and cooperative. Patient is in no acute distress.  Skin: Skin is warm and dry. No rash noted.   Cardiovascular: Normal heart rate noted  Respiratory: Normal respiratory effort, no problems with respiration noted  Abdomen: Soft, gravid, appropriate for gestational age.  Pain/Pressure: Present     Pelvic: Cervical exam performed in the presence of a chaperone Dilation: Fingertip Effacement (%): Thick Station: -3  Extremities: Normal range of motion.  Edema: None  Mental Status: Normal mood and affect. Normal behavior. Normal judgment and thought content.   Assessment and Plan:  Pregnancy: [id number] at [redacted]w[redacted]d 1. Supervision of other normal pregnancy, antepartum Patient is doing well without complaints Dicussed IOL at 41 weeks if no SOL. IOL to be scheduled at next visit  2. Group B Streptococcus carrier, +RV culture, currently pregnant Prophylaxis in labor  Term labor symptoms and general obstetric precautions  including but not limited to vaginal bleeding, contractions, leaking of fluid and fetal movement were reviewed in detail with the patient. Please refer to After Visit Summary for other counseling recommendations.   Return in about 1 week (around 03/03/2020) for in person, ROB, Low risk.  No future appointments.  Catalina Antigua, MD

## 2020-02-25 NOTE — Progress Notes (Signed)
ROB 38wks  CC: notes when walking 2 days ago had swelling in hands that went away later.   Pt would like cervix check.

## 2020-03-02 ENCOUNTER — Encounter: Payer: Self-pay | Admitting: Obstetrics & Gynecology

## 2020-03-02 ENCOUNTER — Ambulatory Visit (INDEPENDENT_AMBULATORY_CARE_PROVIDER_SITE_OTHER): Payer: Medicaid Other | Admitting: Obstetrics & Gynecology

## 2020-03-02 ENCOUNTER — Other Ambulatory Visit: Payer: Self-pay

## 2020-03-02 VITALS — BP 131/79 | HR 102 | Wt 205.8 lb

## 2020-03-02 DIAGNOSIS — Z3A39 39 weeks gestation of pregnancy: Secondary | ICD-10-CM

## 2020-03-02 DIAGNOSIS — O480 Post-term pregnancy: Secondary | ICD-10-CM

## 2020-03-02 DIAGNOSIS — O99820 Streptococcus B carrier state complicating pregnancy: Secondary | ICD-10-CM

## 2020-03-02 DIAGNOSIS — Z3480 Encounter for supervision of other normal pregnancy, unspecified trimester: Secondary | ICD-10-CM

## 2020-03-02 NOTE — Progress Notes (Addendum)
   PRENATAL VISIT NOTE  Subjective:  Kristina Goodwin is a 30 y.o. [phone] at [redacted]w[redacted]d being seen today for ongoing prenatal care.  She is currently monitored for the following issues for this low-risk pregnancy and has Supervision of other normal pregnancy, antepartum; Midline low back pain with right-sided sciatica; and Group B Streptococcus carrier, +RV culture, currently pregnant on their problem list.  Patient reports no complaints.  Contractions: Not present. Vag. Bleeding: None.  Movement: Present. Denies leaking of fluid.   The following portions of the patient's history were reviewed and updated as appropriate: allergies, current medications, past family history, past medical history, past social history, past surgical history and problem list.   Objective:   Vitals:   03/02/20 1349  BP: 131/79  Pulse: (!) 102  Weight: 205 lb 12.8 oz (93.4 kg)    Fetal Status: Fetal Heart Rate (bpm): 131 Fundal Height: 38 cm Movement: Present  Presentation: Vertex  General:  Alert, oriented and cooperative. Patient is in no acute distress.  Skin: Skin is warm and dry. No rash noted.   Cardiovascular: Normal heart rate noted  Respiratory: Normal respiratory effort, no problems with respiration noted  Abdomen: Soft, gravid, appropriate for gestational age.  Pain/Pressure: Present     Pelvic: Cervical exam performed in the presence of a chaperone Dilation: Fingertip Effacement (%): 20 Station: -3  Extremities: Normal range of motion.  Edema: None  Mental Status: Normal mood and affect. Normal behavior. Normal judgment and thought content.   Assessment and Plan:  Pregnancy: [id number] at [redacted]w[redacted]d 1. [redacted] weeks gestation of pregnancy - on PNV - induction scheduled for 03/12/2019 at 40 5/7 -- [place] MFM FETAL BPP W/NONSTRESS; Future  2. Supervision of other normal pregnancy, antepartum  3. Group B Streptococcus carrier, +RV culture, currently pregnant - pt aware of results and will received antibiotics in  labor  Term labor symptoms and general obstetric precautions including but not limited to vaginal bleeding, contractions, leaking of fluid and fetal movement were reviewed in detail with the patient. Please refer to After Visit Summary for other counseling recommendations.   Return in about 1 year (around 03/02/2021) for BPP will be scheduled for post dates.  Future Appointments  Date Time Provider Department Center  03/06/2020  9:15 AM [facility] NURSE [facility]  03/06/2020  9:30 AM [facility] US3 [facility]  03/06/2020 10:45 AM [facility] NST [facility]  03/09/2020  1:45 PM [facility]-SCREENING [facility] None  03/09/2020  2:15 PM Warden Fillers, MD [facility] None  03/11/2020 12:00 AM [facility] SCHED ROOM [facility] None    Jerene Bears, MD

## 2020-03-03 ENCOUNTER — Encounter (HOSPITAL_COMMUNITY): Payer: Self-pay | Admitting: *Deleted

## 2020-03-03 ENCOUNTER — Telehealth (HOSPITAL_COMMUNITY): Payer: Self-pay | Admitting: *Deleted

## 2020-03-03 NOTE — Telephone Encounter (Signed)
Preadmission screen  

## 2020-03-05 ENCOUNTER — Other Ambulatory Visit: Payer: Self-pay | Admitting: Advanced Practice Midwife

## 2020-03-06 ENCOUNTER — Other Ambulatory Visit: Payer: Self-pay | Admitting: Obstetrics & Gynecology

## 2020-03-06 ENCOUNTER — Ambulatory Visit: Payer: Medicaid Other | Admitting: *Deleted

## 2020-03-06 ENCOUNTER — Ambulatory Visit: Payer: Medicaid Other | Attending: Obstetrics and Gynecology

## 2020-03-06 ENCOUNTER — Encounter: Payer: Self-pay | Admitting: *Deleted

## 2020-03-06 ENCOUNTER — Other Ambulatory Visit: Payer: Self-pay

## 2020-03-06 VITALS — BP 137/72 | HR 86

## 2020-03-06 DIAGNOSIS — O480 Post-term pregnancy: Secondary | ICD-10-CM | POA: Diagnosis not present

## 2020-03-06 DIAGNOSIS — Z3490 Encounter for supervision of normal pregnancy, unspecified, unspecified trimester: Secondary | ICD-10-CM | POA: Insufficient documentation

## 2020-03-06 DIAGNOSIS — Z362 Encounter for other antenatal screening follow-up: Secondary | ICD-10-CM | POA: Diagnosis present

## 2020-03-06 DIAGNOSIS — Z3A40 40 weeks gestation of pregnancy: Secondary | ICD-10-CM | POA: Diagnosis not present

## 2020-03-06 NOTE — Procedures (Signed)
Kristina Goodwin 19-Jul-1990 102w0d  Fetus A Non-Stress Test Interpretation for 03/06/20  Indication: 40 weeks  Fetal Heart Rate A Mode: External Baseline Rate (A): 130 bpm Variability: Moderate Accelerations: 15 x 15 Decelerations: None Multiple birth?: No  Uterine Activity Mode: Palpation,Toco Contraction Frequency (min): Irreg Contraction Duration (sec): 20-50 Contraction Quality: Mild Resting Tone Palpated: Relaxed Resting Time: Adequate  Interpretation (Fetal Testing) Nonstress Test Interpretation: Reactive Comments: Dr. Parke Poisson reviewed tracing.

## 2020-03-08 ENCOUNTER — Other Ambulatory Visit: Payer: Self-pay | Admitting: Advanced Practice Midwife

## 2020-03-09 ENCOUNTER — Other Ambulatory Visit: Payer: Self-pay

## 2020-03-09 ENCOUNTER — Ambulatory Visit (INDEPENDENT_AMBULATORY_CARE_PROVIDER_SITE_OTHER): Payer: Medicaid Other | Admitting: Obstetrics and Gynecology

## 2020-03-09 ENCOUNTER — Other Ambulatory Visit (HOSPITAL_COMMUNITY): Payer: Medicaid Other | Attending: Family Medicine

## 2020-03-09 VITALS — BP 113/76 | HR 114 | Wt 206.0 lb

## 2020-03-09 DIAGNOSIS — O480 Post-term pregnancy: Secondary | ICD-10-CM | POA: Diagnosis not present

## 2020-03-09 DIAGNOSIS — O99820 Streptococcus B carrier state complicating pregnancy: Secondary | ICD-10-CM

## 2020-03-09 DIAGNOSIS — Z3A40 40 weeks gestation of pregnancy: Secondary | ICD-10-CM | POA: Diagnosis not present

## 2020-03-09 DIAGNOSIS — Z3480 Encounter for supervision of other normal pregnancy, unspecified trimester: Secondary | ICD-10-CM

## 2020-03-09 NOTE — Patient Instructions (Signed)
Labor Induction Labor induction is when steps are taken to cause a pregnant woman to begin the labor process. Most women go into labor on their own between 37 weeks and 42 weeks of pregnancy. When this does not happen, or when there is a medical need for labor to begin, steps may be taken to induce, or bring on, labor. Labor induction causes a pregnant woman's uterus to contract. It also causes the cervix to soften (ripen), open (dilate), and thin out. Usually, labor is not induced before 39 weeks of pregnancy unless there is a medical reason to do so. When is labor induction considered? Labor induction may be right for you if:  Your pregnancy lasts longer than 41 to 42 weeks.  Your placenta is separating from your uterus (placental abruption).  You have a rupture of membranes and your labor does not begin.  You have health problems, like diabetes or high blood pressure (preeclampsia) during your pregnancy.  Your baby has stopped growing or does not have enough amniotic fluid. Before labor induction begins, your health care provider will consider the following factors:  Your medical condition and the baby's condition.  How many weeks you have been pregnant.  How mature the baby's lungs are.  The condition of your cervix.  The position of the baby.  The size of your birth canal. Tell a health care provider about:  Any allergies you have.  All medicines you are taking, including vitamins, herbs, eye drops, creams, and over-the-counter medicines.  Any problems you or your family members have had with anesthetic medicines.  Any surgeries you have had.  Any blood disorders you have.  Any medical conditions you have. What are the risks? Generally, this is a safe procedure. However, problems may occur, including:  Failed induction.  Changes in fetal heart rate, such as being too high, too low, or irregular (erratic).  Infection in the mother or the baby.  Increased risk of  having a cesarean delivery.  Breaking off (abruption) of the placenta from the uterus. This is rare.  Rupture of the uterus. This is very rare.  Your baby could fail to get enough blood flow or oxygen. This can be life-threatening. When induction is needed for medical reasons, the benefits generally outweigh the risks. What happens during the procedure? During the procedure, your health care provider will use one of these methods to induce labor:  Stripping the membranes. In this method, the amniotic sac tissue is gently separated from the cervix. This causes the following to happen: ? Your cervix stretches, which in turn causes the release of prostaglandins. ? Prostaglandins induce labor and cause the uterus to contract. ? This procedure is often done in an office visit. You will be sent home to wait for contractions to begin.  Prostaglandin medicine. This medicine starts contractions and causes the cervix to dilate and ripen. This can be taken by mouth (orally) or by being inserted into the vagina (suppository).  Inserting a small, thin tube (catheter) with a balloon into the vagina and then expanding the balloon with water to dilate the cervix.  Breaking the water. In this method, a small instrument is used to make a small hole in the amniotic sac. This eventually causes the amniotic sac to break. Contractions should begin within a few hours.  Medicine to trigger or strengthen contractions. This medicine is given through an IV that is inserted into a vein in your arm. This procedure may vary among health care providers and hospitals.     Where to find more information  [employer] of D[employer]: www.marchofdimes.org  The American College of Obstetricians and Gynecologists: www.acog.org Summary  Labor induction causes a pregnant woman's uterus to contract. It also causes the cervix to soften (ripen), open (dilate), and thin out.  Labor is usually not induced before 39 weeks of pregnancy unless  there is a medical reason to do so.  When induction is needed for medical reasons, the benefits generally outweigh the risks.  Talk with your health care provider about which methods of labor induction are right for you. This information is not intended to replace advice given to you by your health care provider. Make sure you discuss any questions you have with your health care provider. Document Revised: 10/28/2019 Document Reviewed: 10/28/2019 Elsevier Patient Education  2021 Elsevier Inc.  

## 2020-03-09 NOTE — Progress Notes (Signed)
Postdates OB/NST, reports no problems today.

## 2020-03-09 NOTE — Progress Notes (Signed)
   PRENATAL VISIT NOTE  Subjective:  Kristina Goodwin is a 30 y.o. [phone] at [redacted]w[redacted]d being seen today for ongoing prenatal care.  She is currently monitored for the following issues for this low-risk pregnancy and has Supervision of other normal pregnancy, antepartum; Midline low back pain with right-sided sciatica; Group B Streptococcus carrier, +RV culture, currently pregnant; [redacted] weeks gestation of pregnancy; and Post term pregnancy on their problem list.  Patient doing well with no acute concerns today. She reports no complaints.  Contractions: Irregular. Vag. Bleeding: None.  Movement: Present. Denies leaking of fluid.   IOL scheduled for 2/12  The following portions of the patient's history were reviewed and updated as appropriate: allergies, current medications, past family history, past medical history, past social history, past surgical history and problem list. Problem list updated.  Objective:   Vitals:   03/09/20 1358  BP: 113/76  Pulse: (!) 114  Weight: 206 lb (93.4 kg)    Fetal Status: Fetal Heart Rate (bpm): NST   Movement: Present     General:  Alert, oriented and cooperative. Patient is in no acute distress.  Skin: Skin is warm and dry. No rash noted.   Cardiovascular: Normal heart rate noted  Respiratory: Normal respiratory effort, no problems with respiration noted  Abdomen: Soft, gravid, appropriate for gestational age.  Pain/Pressure: Present     Pelvic: Cervical exam performed Dilation: Fingertip Effacement (%): 40 Station: -3  Extremities: Normal range of motion.  Edema: None  Mental Status:  Normal mood and affect. Normal behavior. Normal judgment and thought content.  NST:  Baseline 150s,  Duration 42 minutes, moderate variability, multiple accels, occasional small variable decels, toco: uterine irritabilty Assessment and Plan:  Pregnancy: [id number] at [redacted]w[redacted]d  1. Supervision of other normal pregnancy, antepartum IOL  2. Group B Streptococcus carrier, +RV  culture, currently pregnant Treat in labor  3. [redacted] weeks gestation of pregnancy  - Fetal nonstress test  4. Post-term pregnancy, 40-42 weeks of gestation  - Fetal nonstress test  Term labor symptoms and general obstetric precautions including but not limited to vaginal bleeding, contractions, leaking of fluid and fetal movement were reviewed in detail with the patient.  Please refer to After Visit Summary for other counseling recommendations.   No follow-ups on file.   Mariel Aloe, MD Faculty Attending [facility]

## 2020-03-11 ENCOUNTER — Encounter (HOSPITAL_COMMUNITY): Payer: Self-pay | Admitting: Family Medicine

## 2020-03-11 ENCOUNTER — Inpatient Hospital Stay (HOSPITAL_COMMUNITY)
Admission: AD | Admit: 2020-03-11 | Discharge: 2020-03-13 | DRG: 798 | Disposition: A | Discharge status: Home / Self Care | Payer: Medicaid Other | Attending: Obstetrics and Gynecology | Admitting: Obstetrics and Gynecology

## 2020-03-11 ENCOUNTER — Other Ambulatory Visit: Payer: Self-pay

## 2020-03-11 ENCOUNTER — Inpatient Hospital Stay (HOSPITAL_COMMUNITY): Payer: Medicaid Other

## 2020-03-11 ENCOUNTER — Inpatient Hospital Stay (HOSPITAL_COMMUNITY): Payer: Medicaid Other | Admitting: Anesthesiology

## 2020-03-11 DIAGNOSIS — Z9079 Acquired absence of other genital organ(s): Secondary | ICD-10-CM

## 2020-03-11 DIAGNOSIS — O99820 Streptococcus B carrier state complicating pregnancy: Secondary | ICD-10-CM

## 2020-03-11 DIAGNOSIS — O480 Post-term pregnancy: Secondary | ICD-10-CM

## 2020-03-11 DIAGNOSIS — Z7722 Contact with and (suspected) exposure to environmental tobacco smoke (acute) (chronic): Secondary | ICD-10-CM | POA: Diagnosis present

## 2020-03-11 DIAGNOSIS — O99824 Streptococcus B carrier state complicating childbirth: Secondary | ICD-10-CM | POA: Diagnosis not present

## 2020-03-11 DIAGNOSIS — O9902 Anemia complicating childbirth: Secondary | ICD-10-CM | POA: Diagnosis not present

## 2020-03-11 DIAGNOSIS — Z20822 Contact with and (suspected) exposure to covid-19: Secondary | ICD-10-CM | POA: Diagnosis present

## 2020-03-11 DIAGNOSIS — Z3A40 40 weeks gestation of pregnancy: Secondary | ICD-10-CM

## 2020-03-11 DIAGNOSIS — D649 Anemia, unspecified: Secondary | ICD-10-CM | POA: Diagnosis not present

## 2020-03-11 DIAGNOSIS — Z302 Encounter for sterilization: Secondary | ICD-10-CM | POA: Diagnosis not present

## 2020-03-11 DIAGNOSIS — Z3480 Encounter for supervision of other normal pregnancy, unspecified trimester: Secondary | ICD-10-CM

## 2020-03-11 LAB — CBC
HCT: 28.4 % — ABNORMAL LOW (ref 36.0–46.0)
Hemoglobin: 8.8 g/dL — ABNORMAL LOW (ref 12.0–15.0)
MCH: 25.7 pg — ABNORMAL LOW (ref 26.0–34.0)
MCHC: 31 g/dL (ref 30.0–36.0)
MCV: 83 fL (ref 80.0–100.0)
Platelets: 331 10*3/uL (ref 150–400)
RBC: 3.42 MIL/uL — ABNORMAL LOW (ref 3.87–5.11)
RDW: 16 % — ABNORMAL HIGH (ref 11.5–15.5)
WBC: 9 10*3/uL (ref 4.0–10.5)
nRBC: 0 % (ref 0.0–0.2)

## 2020-03-11 LAB — RESP PANEL BY RT-PCR (FLU A&B, COVID) ARPGX2
Influenza A by PCR: NEGATIVE
Influenza B by PCR: NEGATIVE
SARS Coronavirus 2 by RT PCR: NEGATIVE

## 2020-03-11 LAB — TYPE AND SCREEN
ABO/RH(D): O POS
Antibody Screen: NEGATIVE

## 2020-03-11 LAB — RPR: RPR Ser Ql: NONREACTIVE

## 2020-03-11 MED ORDER — DIPHENHYDRAMINE HCL 25 MG PO CAPS: 25.0000 mg | ORAL_CAPSULE | Freq: Four times a day (QID) | ORAL | Status: DC | PRN

## 2020-03-11 MED ORDER — PHENYLEPHRINE 40 MCG/ML (10ML) SYRINGE FOR IV PUSH (FOR BLOOD PRESSURE SUPPORT): 80.0000 ug | PREFILLED_SYRINGE | INTRAVENOUS | Status: DC | PRN

## 2020-03-11 MED ORDER — TERBUTALINE SULFATE 1 MG/ML IJ SOLN: 0.2500 mg | Freq: Once | INTRAMUSCULAR | Status: DC | PRN

## 2020-03-11 MED ORDER — DIBUCAINE (PERIANAL) 1 % EX OINT: 1.0000 "application " | TOPICAL_OINTMENT | CUTANEOUS | Status: DC | PRN

## 2020-03-11 MED ORDER — SIMETHICONE 80 MG PO CHEW: 80.0000 mg | CHEWABLE_TABLET | ORAL | Status: DC | PRN

## 2020-03-11 MED ORDER — LIDOCAINE HCL (PF) 1 % IJ SOLN
INTRAMUSCULAR | Status: DC | PRN
  Administered 2020-03-11: 10 mL via EPIDURAL

## 2020-03-11 MED ORDER — OXYCODONE-ACETAMINOPHEN 5-325 MG PO TABS
1.0000 | ORAL_TABLET | ORAL | Status: DC | PRN
Start: 2020-03-11 — End: 2020-03-11

## 2020-03-11 MED ORDER — SODIUM CHLORIDE 0.9 % IV SOLN
5.0000 10*6.[IU] | Freq: Once | INTRAVENOUS | Status: AC
  Administered 2020-03-11: 5 10*6.[IU] via INTRAVENOUS
  Filled 2020-03-11: qty 5

## 2020-03-11 MED ORDER — FERROUS SULFATE 325 (65 FE) MG PO TABS: 325.0000 mg | ORAL_TABLET | ORAL | Status: DC

## 2020-03-11 MED ORDER — SOD CITRATE-CITRIC ACID 500-334 MG/5ML PO SOLN: 30.0000 mL | ORAL | Status: DC | PRN

## 2020-03-11 MED ORDER — SENNOSIDES-DOCUSATE SODIUM 8.6-50 MG PO TABS: 2.0000 | ORAL_TABLET | Freq: Every day | ORAL | Status: DC

## 2020-03-11 MED ORDER — MISOPROSTOL 50MCG HALF TABLET
50.0000 ug | ORAL_TABLET | Freq: Once | ORAL | Status: AC
  Administered 2020-03-11: 50 ug via BUCCAL

## 2020-03-11 MED ORDER — LACTATED RINGERS IV SOLN: 500.0000 mL | Freq: Once | INTRAVENOUS | Status: DC

## 2020-03-11 MED ORDER — PHENYLEPHRINE 40 MCG/ML (10ML) SYRINGE FOR IV PUSH (FOR BLOOD PRESSURE SUPPORT)
80.0000 ug | PREFILLED_SYRINGE | INTRAVENOUS | Status: DC | PRN
  Filled 2020-03-11: qty 10

## 2020-03-11 MED ORDER — OXYTOCIN-SODIUM CHLORIDE 30-0.9 UT/500ML-% IV SOLN
2.5000 [IU]/h | INTRAVENOUS | Status: DC
  Filled 2020-03-11: qty 500

## 2020-03-11 MED ORDER — EPHEDRINE 5 MG/ML INJ: 10.0000 mg | INTRAVENOUS | Status: DC | PRN

## 2020-03-11 MED ORDER — TETANUS-DIPHTH-ACELL PERTUSSIS 5-2.5-18.5 LF-MCG/0.5 IM SUSY: 0.5000 mL | PREFILLED_SYRINGE | Freq: Once | INTRAMUSCULAR | Status: DC

## 2020-03-11 MED ORDER — OXYTOCIN BOLUS FROM INFUSION: 333.0000 mL | Freq: Once | INTRAVENOUS | Status: DC

## 2020-03-11 MED ORDER — MISOPROSTOL 50MCG HALF TABLET
ORAL_TABLET | ORAL | Status: AC
  Filled 2020-03-11: qty 1

## 2020-03-11 MED ORDER — BENZOCAINE-MENTHOL 20-0.5 % EX AERO
1.0000 | INHALATION_SPRAY | CUTANEOUS | Status: DC | PRN
Start: 2020-03-11 — End: 2020-03-13
  Administered 2020-03-12: 1 via TOPICAL
  Filled 2020-03-11: qty 56

## 2020-03-11 MED ORDER — ONDANSETRON HCL 4 MG/2ML IJ SOLN: 4.0000 mg | Freq: Four times a day (QID) | INTRAMUSCULAR | Status: DC | PRN

## 2020-03-11 MED ORDER — OXYCODONE-ACETAMINOPHEN 5-325 MG PO TABS
2.0000 | ORAL_TABLET | ORAL | Status: DC | PRN
Start: 2020-03-11 — End: 2020-03-11

## 2020-03-11 MED ORDER — PENICILLIN G POT IN DEXTROSE 60000 UNIT/ML IV SOLN
3.0000 10*6.[IU] | INTRAVENOUS | Status: DC
  Administered 2020-03-11 (×3): 3 10*6.[IU] via INTRAVENOUS
  Filled 2020-03-11 (×3): qty 50

## 2020-03-11 MED ORDER — PRENATAL MULTIVITAMIN CH: 1.0000 | ORAL_TABLET | Freq: Every day | ORAL | Status: DC

## 2020-03-11 MED ORDER — COCONUT OIL OIL: 1.0000 "application " | TOPICAL_OIL | Status: DC | PRN

## 2020-03-11 MED ORDER — FENTANYL-BUPIVACAINE-NACL 0.5-0.125-0.9 MG/250ML-% EP SOLN
12.0000 mL/h | EPIDURAL | Status: DC | PRN
  Administered 2020-03-11: 12 mL/h via EPIDURAL
  Filled 2020-03-11: qty 250

## 2020-03-11 MED ORDER — FENTANYL CITRATE (PF) 100 MCG/2ML IJ SOLN
100.0000 ug | INTRAMUSCULAR | Status: DC | PRN
  Administered 2020-03-11 (×3): 100 ug via INTRAVENOUS
  Filled 2020-03-11 (×2): qty 2

## 2020-03-11 MED ORDER — OXYTOCIN-SODIUM CHLORIDE 30-0.9 UT/500ML-% IV SOLN
1.0000 m[IU]/min | INTRAVENOUS | Status: DC
  Administered 2020-03-11: 2 m[IU]/min via INTRAVENOUS

## 2020-03-11 MED ORDER — DIPHENHYDRAMINE HCL 50 MG/ML IJ SOLN: 12.5000 mg | INTRAMUSCULAR | Status: DC | PRN

## 2020-03-11 MED ORDER — ONDANSETRON HCL 4 MG PO TABS: 4.0000 mg | ORAL_TABLET | ORAL | Status: DC | PRN

## 2020-03-11 MED ORDER — ONDANSETRON HCL 4 MG/2ML IJ SOLN: 4.0000 mg | INTRAMUSCULAR | Status: DC | PRN

## 2020-03-11 MED ORDER — MISOPROSTOL 25 MCG QUARTER TABLET: 25.0000 ug | ORAL_TABLET | ORAL | Status: DC | PRN

## 2020-03-11 MED ORDER — LACTATED RINGERS IV SOLN
500.0000 mL | INTRAVENOUS | Status: DC | PRN
  Administered 2020-03-11: 500 mL via INTRAVENOUS

## 2020-03-11 MED ORDER — LIDOCAINE HCL (PF) 1 % IJ SOLN: 30.0000 mL | INTRAMUSCULAR | Status: DC | PRN

## 2020-03-11 MED ORDER — IBUPROFEN 600 MG PO TABS
600.0000 mg | ORAL_TABLET | Freq: Four times a day (QID) | ORAL | Status: DC
  Administered 2020-03-11 – 2020-03-13 (×5): 600 mg via ORAL
  Filled 2020-03-11 (×5): qty 1

## 2020-03-11 MED ORDER — LACTATED RINGERS IV SOLN
INTRAVENOUS | Status: DC
  Administered 2020-03-11: 125 mL/h via INTRAVENOUS

## 2020-03-11 MED ORDER — SODIUM CHLORIDE 0.9 % IV SOLN
500.0000 mg | Freq: Once | INTRAVENOUS | Status: AC
  Administered 2020-03-11: 500 mg via INTRAVENOUS
  Filled 2020-03-11: qty 25

## 2020-03-11 MED ORDER — ACETAMINOPHEN 325 MG PO TABS
650.0000 mg | ORAL_TABLET | Freq: Four times a day (QID) | ORAL | Status: DC
  Administered 2020-03-11 – 2020-03-13 (×5): 650 mg via ORAL
  Filled 2020-03-11 (×5): qty 2

## 2020-03-11 MED ORDER — ACETAMINOPHEN 325 MG PO TABS: 650.0000 mg | ORAL_TABLET | ORAL | Status: DC | PRN

## 2020-03-11 MED ORDER — WITCH HAZEL-GLYCERIN EX PADS: 1.0000 "application " | MEDICATED_PAD | CUTANEOUS | Status: DC | PRN

## 2020-03-11 MED ORDER — FLEET ENEMA 7-19 GM/118ML RE ENEM: 1.0000 | ENEMA | RECTAL | Status: DC | PRN

## 2020-03-11 NOTE — Progress Notes (Signed)
Labor Progress Note Kristina Goodwin is a 30 y.o. [phone] at [redacted]w[redacted]d presented for post-dates IOL.  S: Patient resting comfortably after receiving her epidural.   O:  BP 119/74   Pulse 78   Temp 98.7 F (37.1 C) (Oral)   Resp 16   Ht 5\' 3"  (1.6 m)   Wt 95.3 kg   LMP 05/31/2019 (Exact Date)   SpO2 100%   BMI 37.20 kg/m  EFM: baseline 130/moderate variability/+accels/no decels Toco: contractions irregular   CVE: Dilation: 4.5 Effacement (%): 70 Cervical Position: Posterior Station: -3 Presentation: Vertex Exam by:: [email or web address] RN   A&P: 30 y.o. 26 [redacted]w[redacted]d presented for post-dates IOL. #Labor: S/p cytotec x2. FB placed at 0620, dislodged 0730. Pitocin started at 1510, currently at 74mU/min. Will continue to titrate Pitocin.  #Pain: epidural #FWB: Category 1 strip #GBS positive, PCN, adequate prophylaxis  11m, DO 6:39 PM

## 2020-03-11 NOTE — Progress Notes (Signed)
Labor Progress Note Kristina Goodwin is a 30 y.o. [phone] at [redacted]w[redacted]d presented for post-dates IOL.  S: Pt resting comfortably. Mild discomfort with contractions.  O:  Temp 98 F (36.7 C) (Oral)   Resp 16   Ht 5\' 3"  (1.6 m)   Wt 95.3 kg   LMP 05/31/2019 (Exact Date)   BMI 37.20 kg/m  EFM: baseline 125/moderate variability/+accels/no decels  CVE: Dilation: 1 Effacement (%): Thick Station: -3 Presentation: Vertex Exam by:: Dr. [email or web address]   A&P: 30 y.o. 26 [redacted]w[redacted]d presented for post-dates IOL. #Labor: S/p cytotec x1. FB placed at 0620. Will plan to initiate pitocin if contraction pattern spaces out. #Pain: TBD per pt preference #FWB: Category 1 strip #GBS positive  [redacted]w[redacted]d, MD 6:31 AM

## 2020-03-11 NOTE — Progress Notes (Signed)
Labor Progress Note Kristina Goodwin is a 30 y.o. [phone] at [redacted]w[redacted]d presented for post-dates IOL.  S: Strip Note   O:  BP 111/69   Pulse 94   Temp 98.7 F (37.1 C) (Oral)   Resp 18   Ht 5\' 3"  (1.6 m)   Wt 95.3 kg   LMP 05/31/2019 (Exact Date)   BMI 37.20 kg/m  EFM: baseline 130/moderate variability/+accels/no decels Toco: contractions about every 3-4 min   CVE: Dilation: 4 Effacement (%): 50 Cervical Position: Posterior Station: Ballotable Presentation: Vertex Exam by:: [email or web address] RN   A&P: 30 y.o. 26 [redacted]w[redacted]d presented for post-dates IOL. #Labor: S/p cytotec x2. FB placed at 0620, dislodged 0730. Cervix slightly thinner at 50% and still high on cervical exam, will start Pitocin 2x2 at this time. #Pain: PRN, desires epidural #FWB: Category 1 strip #GBS positive, PCN, adequate prophylaxis  [redacted]w[redacted]d, DO 2:41 PM

## 2020-03-11 NOTE — Progress Notes (Signed)
Labor Progress Note Kristina Goodwin is a 30 y.o. [phone] at [redacted]w[redacted]d presented for post-dates IOL.  S: Pt resting comfortably. Mild discomfort with contractions.  O:  BP 113/63   Pulse 87   Temp 98 F (36.7 C) (Oral)   Resp 18   Ht 5\' 3"  (1.6 m)   Wt 95.3 kg   LMP 05/31/2019 (Exact Date)   BMI 37.20 kg/m  EFM: baseline 130/moderate variability/+accels/no decels Toco: intermittent  CVE: Dilation: 1 Effacement (%): Thick Station: -3 Presentation: Vertex Exam by:: Dr. [email or web address]   A&P: 31 y.o. 26 [redacted]w[redacted]d presented for post-dates IOL. #Labor: S/p cytotec x1. FB placed at 0620, dislodged 0730. Cervix thick and high on cervical exam, will re-dose cytotec at this time. #Pain: PRN, desires epidural #FWB: Category 1 strip #GBS positive, PCN, adequate prophylaxis  [redacted]w[redacted]d, MD 10:24 AM

## 2020-03-11 NOTE — H&P (Signed)
OBSTETRIC ADMISSION HISTORY AND PHYSICAL  Kristina Goodwin is a 30 y.o. female [phone] with IUP at 66w5dby L/20 presenting for post-dates IOL. She reports +FMs, No LOF, no VB, no blurry vision, headaches or peripheral edema, and RUQ pain.  She plans on bottle feeding. She requests postpartum BTL for birth control.  She received her prenatal care at [facility] L/20 --->  Estimated Date of Delivery: 03/06/20  Sono:  @[redacted]w[redacted]d , CWD, normal anatomy, cephalic presentation, 30350K 73% EFW  Prenatal History/Complications:  - GBS positive status  Past Medical History: Past Medical History:  Diagnosis Date  . Gonorrhea   . H/O varicella   . Hx of chlamydia infection   . Medical history non-contributory   . Trichomonas     Past Surgical History: Past Surgical History:  Procedure Laterality Date  . NO PAST SURGERIES      Obstetrical History: OB History    Gravida  5   Para  3   Term  3   Preterm  0   AB  1   Living  3     SAB  1   IAB  0   Ectopic  0   Multiple  0   Live Births  3           Social History Social History   Socioeconomic History  . Marital status: Single    Spouse name: Not on file  . Number of children: Not on file  . Years of education: Not on file  . Highest education level: Not on file  Occupational History  . Not on file  Tobacco Use  . Smoking status: Passive Smoke Exposure - Never Smoker  . Smokeless tobacco: Never Used  Vaping Use  . Vaping Use: Never used  Substance and Sexual Activity  . Alcohol use: No  . Drug use: Not Currently    Frequency: 5.0 times per week    Types: Marijuana    Comment: last smoked 03/17/2019  . Sexual activity: Yes    Birth control/protection: None    Comment: intercourse 2 months ago  Other Topics Concern  . Not on file  Social History Narrative  . Not on file   Social Determinants of Health   Financial Resource Strain: Not on file  Food Insecurity: Not on file  Transportation  Needs: Not on file  Physical Activity: Not on file  Stress: Not on file  Social Connections: Not on file    Family History: Family History  Problem Relation Age of Onset  . Asthma Father   . Asthma Brother   . Diabetes Maternal Uncle   . Hypertension Maternal Uncle   . Diabetes Maternal Grandmother   . Thyroid disease Cousin   . Vision loss Mother        Glaucoma  . Seizures Mother   . Asthma Mother     Allergies: No Known Allergies  Medications Prior to Admission  Medication Sig Dispense Refill Last Dose  . Blood Pressure Monitoring (BLOOD PRESSURE KIT) DEVI 1 Device by Does not apply route as needed. 1 each 0   . Prenat w/o A-[id number]-[id number]-DSS-FA (CITRANATAL RX) 27-1 MG TABS Take 1 tablet by mouth daily before breakfast. (Patient not taking: Reported on 03/06/2020) 90 tablet 4      Review of Systems   All systems reviewed and negative except as stated in HPI  Last menstrual period 05/31/2019, unknown if currently breastfeeding. General appearance: alert, cooperative and appears stated age  Lungs: normal WOB Heart: regular rate Abdomen: soft, non-tender Extremities: no sign of DVT Presentation: cephalic Fetal monitoringBaseline: 130 bpm, Variability: Good {> 6 bpm), Accelerations: Reactive and Decelerations: Absent Uterine activity Frequency: rare contractions    Prenatal labs: ABO, Rh: O/Positive/-- (09/16 1155) Antibody: Negative (09/16 1155) Rubella: 6.11 (09/16 1155) RPR: Non Reactive (11/11 1038)  HBsAg: Negative (09/16 1155)  HIV: Non Reactive (11/11 1038)  GBS: Positive/-- (01/14 1025)  2 hr Glucola wnl Genetic screening  wnl Anatomy US wnl  Prenatal Transfer Tool  Maternal Diabetes: No Genetic Screening: Normal Maternal Ultrasounds/Referrals: Normal Fetal Ultrasounds or other Referrals:  None Maternal Substance Abuse:  No Significant Maternal Medications:  None Significant Maternal Lab Results: Group B Strep positive  No results found for this or  any previous visit (from the past 24 hour(s)).  Patient Active Problem List   Diagnosis Date Noted  . Post-dates pregnancy 03/11/2020  . [redacted] weeks gestation of pregnancy 03/09/2020  . Post term pregnancy 03/09/2020  . Group B Streptococcus carrier, +RV culture, currently pregnant 02/14/2020  . Midline low back pain with right-sided sciatica 01/06/2020  . Supervision of other normal pregnancy, antepartum 10/14/2019    Assessment/Plan:  ALBERTA LENHARD is a 30 y.o. [id number] at 61w5dhere for post-dates IOL.  #Labor: Given initial cervical exam will administer buccal cytotec x1. Will attempt FB placement at next cervical check. #Pain: TBD per pt preference #FWB:  Category 1 strip #ID: GBS positive; will start PCN on admission #MOF: bottle #MOC: undecided #Circ: n/a  GRanda Ngo MD OB Fellow, Faculty Practice 03/11/2020 12:42 AM

## 2020-03-11 NOTE — Anesthesia Preprocedure Evaluation (Signed)
Anesthesia Evaluation  Patient identified by MRN, date of birth, ID band Patient awake    Reviewed: Allergy & Precautions, H&P , NPO status , Patient's Chart, lab work & pertinent test results  Airway Mallampati: II  TM Distance: >3 FB Neck ROM: Full    Dental no notable dental hx.    Pulmonary neg pulmonary ROS,    Pulmonary exam normal breath sounds clear to auscultation       Cardiovascular negative cardio ROS Normal cardiovascular exam Rhythm:Regular Rate:Normal     Neuro/Psych  Neuromuscular disease (right sided sciatica and back pain) negative psych ROS   GI/Hepatic negative GI ROS, Neg liver ROS,   Endo/Other  negative endocrine ROS  Renal/GU negative Renal ROS  negative genitourinary   Musculoskeletal negative musculoskeletal ROS (+)   Abdominal   Peds negative pediatric ROS (+)  Hematology  (+) anemia ,   Anesthesia Other Findings   Reproductive/Obstetrics negative OB ROS                             Anesthesia Physical Anesthesia Plan  ASA: III  Anesthesia Plan: Epidural   Post-op Pain Management:    Induction:   PONV Risk Score and Plan: 2  Airway Management Planned: Natural Airway  Additional Equipment:   Intra-op Plan:   Post-operative Plan:   Informed Consent: I have reviewed the patients History and Physical, chart, labs and discussed the procedure including the risks, benefits and alternatives for the proposed anesthesia with the patient or authorized representative who has indicated his/her understanding and acceptance.       Plan Discussed with: Anesthesiologist  Anesthesia Plan Comments:         Anesthesia Quick Evaluation

## 2020-03-11 NOTE — Anesthesia Procedure Notes (Signed)
Epidural Patient location during procedure: OB Start time: 03/11/2020 3:45 PM End time: 03/11/2020 3:55 PM  Staffing Anesthesiologist: Mellody Dance, MD Performed: anesthesiologist   Preanesthetic Checklist Completed: patient identified, IV checked, site marked, risks and benefits discussed, monitors and equipment checked, pre-op evaluation and timeout performed  Epidural Patient position: sitting Prep: DuraPrep Patient monitoring: heart rate, cardiac monitor, continuous pulse ox and blood pressure Approach: midline Location: L2-L3 Injection technique: LOR saline  Needle:  Needle type: Tuohy  Needle gauge: 17 G Needle length: 9 cm Needle insertion depth: 7 cm Catheter type: closed end flexible Catheter size: 20 Guage Catheter at skin depth: 12 cm Test dose: negative and Other  Assessment Events: blood not aspirated, injection not painful, no injection resistance, paresthesia (resolved ) and negative IV test  Additional Notes Informed consent obtained prior to proceeding including risk of failure, 1% risk of PDPH, risk of minor discomfort and bruising.  Discussed rare but serious complications including epidural abscess, permanent nerve injury, epidural hematoma.  Discussed alternatives to epidural analgesia and patient desires to proceed.  Timeout performed pre-procedure verifying patient name, procedure, and platelet count.  Patient tolerated procedure well.  Patient known history of right sided sciatica. + right-sided paresthesia with catheter threading. Catheter removed and different space chosen. Repeat catheter threaded easily. Patient stated paresthesia resolved and did not recur.

## 2020-03-11 NOTE — Plan of Care (Signed)

## 2020-03-12 ENCOUNTER — Encounter (HOSPITAL_COMMUNITY): Payer: Self-pay | Admitting: Family Medicine

## 2020-03-12 ENCOUNTER — Inpatient Hospital Stay (HOSPITAL_COMMUNITY): Payer: Medicaid Other | Admitting: Anesthesiology

## 2020-03-12 ENCOUNTER — Encounter (HOSPITAL_COMMUNITY)
Admission: AD | Disposition: A | Discharge status: Home / Self Care | Payer: Self-pay | Source: Home / Self Care | Attending: Obstetrics and Gynecology

## 2020-03-12 DIAGNOSIS — Z302 Encounter for sterilization: Secondary | ICD-10-CM

## 2020-03-12 DIAGNOSIS — Z9079 Acquired absence of other genital organ(s): Secondary | ICD-10-CM

## 2020-03-12 HISTORY — PX: TUBAL LIGATION: SHX77

## 2020-03-12 SURGERY — LIGATION, FALLOPIAN TUBE, POSTPARTUM
Anesthesia: Epidural | Wound class: Clean Contaminated

## 2020-03-12 MED ORDER — CHLOROPROCAINE HCL (PF) 3 % IJ SOLN
INTRAMUSCULAR | Status: DC | PRN
  Administered 2020-03-12: 10 mL

## 2020-03-12 MED ORDER — FENTANYL CITRATE (PF) 100 MCG/2ML IJ SOLN
INTRAMUSCULAR | Status: AC
  Filled 2020-03-12: qty 2

## 2020-03-12 MED ORDER — LACTATED RINGERS IV BOLUS
500.0000 mL | Freq: Once | INTRAVENOUS | Status: AC
  Administered 2020-03-12: 500 mL via INTRAVENOUS

## 2020-03-12 MED ORDER — FENTANYL CITRATE (PF) 100 MCG/2ML IJ SOLN
INTRAMUSCULAR | Status: DC | PRN
  Administered 2020-03-12: 100 ug via INTRAVENOUS

## 2020-03-12 MED ORDER — ONDANSETRON HCL 4 MG/2ML IJ SOLN
INTRAMUSCULAR | Status: AC
  Filled 2020-03-12: qty 2

## 2020-03-12 MED ORDER — LACTATED RINGERS IV SOLN: INTRAVENOUS | Status: DC | PRN

## 2020-03-12 MED ORDER — MIDAZOLAM HCL 5 MG/5ML IJ SOLN
INTRAMUSCULAR | Status: DC | PRN
  Administered 2020-03-12: 2 mg via INTRAVENOUS

## 2020-03-12 MED ORDER — SODIUM CHLORIDE 0.9 % IR SOLN
Status: DC | PRN
  Administered 2020-03-12: 1

## 2020-03-12 MED ORDER — DEXAMETHASONE SODIUM PHOSPHATE 10 MG/ML IJ SOLN
INTRAMUSCULAR | Status: AC
  Filled 2020-03-12: qty 1

## 2020-03-12 MED ORDER — MIDAZOLAM HCL 2 MG/2ML IJ SOLN
INTRAMUSCULAR | Status: AC
  Filled 2020-03-12: qty 2

## 2020-03-12 MED ORDER — KETOROLAC TROMETHAMINE 30 MG/ML IJ SOLN: 30.0000 mg | Freq: Once | INTRAMUSCULAR | Status: DC | PRN

## 2020-03-12 MED ORDER — LIDOCAINE-EPINEPHRINE (PF) 2 %-1:200000 IJ SOLN
INTRAMUSCULAR | Status: DC | PRN
  Administered 2020-03-12 (×2): 5 mL via EPIDURAL
  Administered 2020-03-12: 10 mL via EPIDURAL

## 2020-03-12 MED ORDER — ONDANSETRON HCL 4 MG/2ML IJ SOLN: 4.0000 mg | Freq: Once | INTRAMUSCULAR | Status: DC | PRN

## 2020-03-12 MED ORDER — ONDANSETRON HCL 4 MG/2ML IJ SOLN
INTRAMUSCULAR | Status: DC | PRN
  Administered 2020-03-12: 4 mg via INTRAVENOUS

## 2020-03-12 MED ORDER — OXYCODONE HCL 5 MG PO TABS: 5.0000 mg | ORAL_TABLET | Freq: Once | ORAL | Status: DC | PRN

## 2020-03-12 MED ORDER — FAMOTIDINE 20 MG PO TABS
40.0000 mg | ORAL_TABLET | Freq: Once | ORAL | Status: AC
  Administered 2020-03-12: 40 mg via ORAL
  Filled 2020-03-12: qty 2

## 2020-03-12 MED ORDER — METOCLOPRAMIDE HCL 10 MG PO TABS
10.0000 mg | ORAL_TABLET | Freq: Once | ORAL | Status: AC
  Administered 2020-03-12: 10 mg via ORAL
  Filled 2020-03-12: qty 1

## 2020-03-12 MED ORDER — BUPIVACAINE HCL (PF) 0.25 % IJ SOLN
INTRAMUSCULAR | Status: AC
  Filled 2020-03-12: qty 30

## 2020-03-12 MED ORDER — DEXAMETHASONE SODIUM PHOSPHATE 10 MG/ML IJ SOLN
INTRAMUSCULAR | Status: DC | PRN
  Administered 2020-03-12: 10 mg via INTRAVENOUS

## 2020-03-12 MED ORDER — OXYCODONE HCL 5 MG/5ML PO SOLN: 5.0000 mg | Freq: Once | ORAL | Status: DC | PRN

## 2020-03-12 MED ORDER — LACTATED RINGERS IV SOLN: INTRAVENOUS | Status: DC

## 2020-03-12 MED ORDER — HYDROMORPHONE HCL 1 MG/ML IJ SOLN: 0.2500 mg | INTRAMUSCULAR | Status: DC | PRN

## 2020-03-12 MED ORDER — LIDOCAINE-EPINEPHRINE (PF) 2 %-1:200000 IJ SOLN: INTRAMUSCULAR | Status: DC | PRN

## 2020-03-12 MED ORDER — CHLOROPROCAINE HCL (PF) 3 % IJ SOLN
INTRAMUSCULAR | Status: AC
  Filled 2020-03-12: qty 20

## 2020-03-12 SURGICAL SUPPLY — 24 items
CLIP FILSHIE TUBAL LIGA STRL (Clip) ×1 IMPLANT
CLOTH BEACON ORANGE TIMEOUT ST (SAFETY) ×2 IMPLANT
DRESSING OPSITE X SMALL 2X3 (GAUZE/BANDAGES/DRESSINGS) ×1 IMPLANT
DRSG OPSITE POSTOP 3X4 (GAUZE/BANDAGES/DRESSINGS) ×2 IMPLANT
DURAPREP 26ML APPLICATOR (WOUND CARE) ×2 IMPLANT
GLOVE BIOGEL PI IND STRL 7.0 (GLOVE) ×2 IMPLANT
GLOVE BIOGEL PI INDICATOR 7.0 (GLOVE) ×2
GLOVE ECLIPSE 7.0 STRL STRAW (GLOVE) ×4 IMPLANT
GOWN STRL REUS W/TWL LRG LVL3 (GOWN DISPOSABLE) ×4 IMPLANT
NEEDLE HYPO 22GX1.5 SAFETY (NEEDLE) ×2 IMPLANT
NS IRRIG 1000ML POUR BTL (IV SOLUTION) ×2 IMPLANT
PACK ABDOMINAL MINOR (CUSTOM PROCEDURE TRAY) ×2 IMPLANT
PROTECTOR NERVE ULNAR (MISCELLANEOUS) ×2 IMPLANT
SPONGE LAP 4X18 RFD (DISPOSABLE) IMPLANT
SUT MON AB 3-0 SH 27 (SUTURE) ×4
SUT MON AB 3-0 SH27 (SUTURE) IMPLANT
SUT MON AB-0 CT1 36 (SUTURE) ×2 IMPLANT
SUT VIC AB 0 CT1 27 (SUTURE) ×2
SUT VIC AB 0 CT1 27XBRD ANBCTR (SUTURE) ×1 IMPLANT
SUT VICRYL 4-0 PS2 18IN ABS (SUTURE) ×2 IMPLANT
SYR CONTROL 10ML LL (SYRINGE) ×2 IMPLANT
TOWEL OR 17X24 6PK STRL BLUE (TOWEL DISPOSABLE) ×4 IMPLANT
TRAY FOLEY CATH SILVER 14FR (SET/KITS/TRAYS/PACK) ×2 IMPLANT
WATER STERILE IRR 1000ML POUR (IV SOLUTION) ×2 IMPLANT

## 2020-03-12 NOTE — Progress Notes (Signed)
Post Partum Day 1 Subjective:  Patient is doing well without complaints. Ambulating without difficulty. Voiding and passing flatus. Tolerating PO. Abdominal pain improved. Vaginal bleeding decreased.  Objective: Blood pressure 127/77, pulse 86, temperature 98.2 F (36.8 C), temperature source Oral, resp. rate 18, height 5\' 3"  (1.6 m), weight 95.3 kg, last menstrual period 05/31/2019, SpO2 98 %, unknown if currently breastfeeding.  Physical Exam:  General: alert, cooperative and no distress Lochia: appropriate Uterine Fundus: firm Incision: n/a DVT Evaluation: No evidence of DVT seen on physical exam.  Recent Labs    03/11/20 0100  HGB 8.8*  HCT 28.4*    Assessment/Plan: PPD#1 VD  -doing well, meeting pp milestones  -bottle feeding  -VSS, monitor BP, last couple BP stable  -discussed BTL, scheduled 1000 this morning, patient scared and unsure, discussed  risks/benefits and risk of regret, patient would like more time to consider  Plan for discharge tomorrow given time of delivery/possible BTL.   LOS: 1 day   05/09/20 03/12/2020, 6:48 AM

## 2020-03-12 NOTE — Discharge Summary (Signed)
Postpartum Discharge Summary  Date of Service updated 03/13/20     Patient Name: Kristina Goodwin DOB: July 09, 1990 MRN: [id number]  Date of admission: 03/11/2020 Delivery date:03/11/2020  Delivering provider: Sheila Oats  Date of discharge: 03/13/2020  Admitting diagnosis: Post-dates pregnancy [O48.0] Intrauterine pregnancy: [redacted]w[redacted]d     Secondary diagnosis:  Principal Problem:   Vaginal delivery Active Problems:   Supervision of other normal pregnancy, antepartum   Group B Streptococcus carrier, +RV culture, currently pregnant   Post term pregnancy   Post-dates pregnancy   Encounter for sterilization  Additional problems: as noted above  Discharge diagnosis: Term Vaginal delivery, anemia of pregnancy (given venofer pre-deliv)                            Post partum procedures:postpartum tubal ligation Augmentation: AROM, Pitocin, Cytotec and IP Foley Complications: None  Hospital course: Induction of Labor With Vaginal Delivery   30 y.o. yo [id number] at [redacted]w[redacted]d was admitted to the hospital 03/11/2020 for induction of labor.  Indication for induction: Postdates.  Patient had an uncomplicated labor course as follows: Membrane Rupture Time/Date: 4:12 PM ,03/11/2020   Delivery Method:Vaginal, Spontaneous  Episiotomy: None  Lacerations:  Periurethral;1st degree  Details of delivery can be found in separate delivery note.  Patient had a routine postpartum course. Bilateral tubal ligation was performed. Patient is discharged home 03/13/20.  Newborn Data: Birth date:03/11/2020  Birth time:8:42 PM  Gender:Female  Living status:Living  Apgars:9 ,10  Weight:3714 g   Magnesium Sulfate received: No BMZ received: No Rhophylac:N/A MMR:N/A T-DaP:Given prenatally Flu: Yes Transfusion:Yes--IV venofer  Physical exam  Vitals:   03/12/20 1240 03/12/20 1250 03/12/20 2034 03/13/20 0550  BP:  114/74 (!) 136/91 127/86  Pulse: 99 88  64  Resp:  20 18 17   Temp:  98.4 F (36.9 C) 98.3 F  (36.8 C) 98.2 F (36.8 C)  TempSrc:  Oral Oral Oral  SpO2: 100%  99% 100%  Weight:      Height:       General: alert, cooperative and no distress Lochia: appropriate Uterine Fundus: firm Incision: Dressing is clean, dry, and intact DVT Evaluation: No evidence of DVT seen on physical exam. Labs: Lab Results  Component Value Date   WBC 9.0 03/11/2020   HGB 8.8 (L) 03/11/2020   HCT 28.4 (L) 03/11/2020   MCV 83.0 03/11/2020   PLT 331 03/11/2020   No flowsheet data found. Edinburgh Score: Edinburgh Postnatal Depression Scale Screening Tool 03/12/2020  I have been able to laugh and see the funny side of things. 0  I have looked forward with enjoyment to things. 0  I have blamed myself unnecessarily when things went wrong. 0  I have been anxious or worried for no good reason. 0  I have felt scared or panicky for no good reason. 0  Things have been getting on top of me. 0  I have been so unhappy that I have had difficulty sleeping. 0  I have felt sad or miserable. 0  I have been so unhappy that I have been crying. 0  The thought of harming myself has occurred to me. 0  Edinburgh Postnatal Depression Scale Total 0     After visit meds:  Allergies as of 03/13/2020   No Known Allergies     Medication List    STOP taking these medications   CitraNatal Rx 27-1 MG Tabs     TAKE these  medications   acetaminophen 500 MG tablet Commonly known as: TYLENOL Take 2 tablets (1,000 mg total) by mouth every 6 (six) hours as needed.   Blood Pressure Kit Devi 1 Device by Does not apply route as needed.   ferrous sulfate 325 (65 FE) MG tablet Take 1 tablet (325 mg total) by mouth every other day. Start taking on: March 14, 2020   ibuprofen 600 MG tablet Commonly known as: ADVIL Take 1 tablet (600 mg total) by mouth every 6 (six) hours as needed.   vitamin C 250 MG tablet Commonly known as: ASCORBIC ACID Take 1 tablet (250 mg total) by mouth every other day. Take with iron         Discharge home in stable condition Infant Feeding: Bottle Infant Disposition:home with mother Discharge instruction: per After Visit Summary and Postpartum booklet. Activity: Advance as tolerated. Pelvic rest for 6 weeks.  Diet: routine diet Future Appointments: Future Appointments  Date Time Provider Department Center  04/17/2020 10:00 AM Adam Phenix, MD [facility] None   Follow up Visit: Message sent to [facility] clinic on 03/12/20 to schedule PP appt.  Please schedule this patient for a In person postpartum visit in 6 weeks with the following provider: Any provider. Additional Postpartum F/U:consider repeat CBC at postpartum appt  Low risk pregnancy complicated by: anemia s/p IV iron on L&D, discharged with PO iron every other day Delivery mode:  Vaginal, Spontaneous  Anticipated Birth Control:  BTL done [facility]   03/13/2020 Alric Seton, MD

## 2020-03-12 NOTE — Transfer of Care (Signed)
Immediate Anesthesia Transfer of Care Note  Patient: Kristina Goodwin  Procedure(s) Performed: POST PARTUM TUBAL LIGATION (N/A )  Patient Location: PACU  Anesthesia Type:Epidural  Level of Consciousness: awake, alert , oriented and patient cooperative  Airway & Oxygen Therapy: Patient Spontanous Breathing  Post-op Assessment: Report given to RN and Post -op Vital signs reviewed and stable  Post vital signs: Reviewed and stable  Last Vitals:  Vitals Value Taken Time  BP    Temp    Pulse 89 03/12/20 1148  Resp 16 03/12/20 1148  SpO2 99 % 03/12/20 1148  Vitals shown include unvalidated device data.  Last Pain:  Vitals:   03/12/20 0731  TempSrc:   PainSc: 0-No pain         Complications: No complications documented.

## 2020-03-12 NOTE — Progress Notes (Signed)
Spoke to patient regarding BTL. Patient states she DOES want to go through with procedure today. OR contacted and updated.

## 2020-03-12 NOTE — Op Note (Signed)
MYKEL MOHL  03/11/2020 - 03/12/2020  PREOPERATIVE DIAGNOSIS:  Multiparity, undesired fertility  POSTOPERATIVE DIAGNOSIS:  Multiparity, undesired fertility  PROCEDURE:  Postpartum Bilateral Tubal Salpingectomy  ANESTHESIA:  Epidural  COMPLICATIONS:  None immediate.  ESTIMATED BLOOD LOSS: 5 ml.  INDICATIONS: 30 y.o. [id number]  with undesired fertility,status post vaginal delivery, desires permanent sterilization.  Other reversible forms of contraception were discussed with patient; she declines all other modalities. Risks of procedure discussed with patient including but not limited to: risk of regret, permanence of method, bleeding, infection, injury to surrounding organs and need for additional procedures.    FINDINGS:  Normal uterus, tubes, and ovaries.  PROCEDURE DETAILS: The patient was taken to the operating room where her spinal anesthesia was dosed up to surgical level and found to be adequate.  She was then placed in a supine position and prepped and draped in the usual sterile fashion.  After an adequate timeout was performed, attention was turned to the patient's abdomen where a small transverse skin incision was made under the umbilical fold. The incision was taken down to the layer of fascia using the scalpel, and fascia was incised, and extended bilaterally. The peritoneum was entered in a sharp fashion. The left fallopian tube was identified and grasped with a Babcock clamp, and followed out to the fimbriated end.  2 Kelly clamps were placed across the tube to incorporate the fimbriated end.  The tube was removed using Metzenbaum scissors 3-0 Monocryl on a SH was used with a Haney type stitch under each Kelly clamp with excellent hemostasis noted.  A similar process was carried out on the right side allowing for bilateral tubal sterilization.  Good hemostasis was noted overall. The instruments were then removed from the patient's abdomen and the fascial incision was repaired with 0  Vicryl, and the skin was closed with a 4-0 Vicryl subcuticular stitch. The patient tolerated the procedure well.  Sponge, lap, and needle counts were correct times two.  The patient was then taken to the recovery room awake and in stable condition.  Reva Bores MD 03/12/2020 11:44 AM

## 2020-03-12 NOTE — Anesthesia Postprocedure Evaluation (Signed)
Anesthesia Post Note  Patient: Kristina Goodwin  Procedure(s) Performed: POST PARTUM TUBAL LIGATION (N/A )     Patient location during evaluation: PACU Anesthesia Type: Epidural Level of consciousness: awake, oriented, awake and alert and patient cooperative Pain management: satisfactory to patient Vital Signs Assessment: post-procedure vital signs reviewed and stable Respiratory status: spontaneous breathing, nonlabored ventilation and respiratory function stable Cardiovascular status: stable Postop Assessment: no apparent nausea or vomiting, patient able to bend at knees, no headache, no backache and epidural receding Anesthetic complications: no   No complications documented.  Last Vitals:  Vitals:   03/12/20 0008 03/12/20 0424  BP: (!) 118/94 127/77  Pulse: 100 86  Resp: 17 18  Temp: 37.2 C 36.8 C  SpO2: 99% 98%    Last Pain:  Vitals:   03/12/20 0731  TempSrc:   PainSc: 0-No pain   Pain Goal:                   Madison Hickman

## 2020-03-12 NOTE — Anesthesia Postprocedure Evaluation (Signed)
Anesthesia Post Note  Patient: Kristina Goodwin  Procedure(s) Performed: AN AD HOC LABOR EPIDURAL     Patient location during evaluation: Mother Baby Anesthesia Type: Epidural Level of consciousness: awake and alert, oriented and patient cooperative Pain management: pain level controlled Vital Signs Assessment: post-procedure vital signs reviewed and stable Respiratory status: spontaneous breathing Cardiovascular status: stable Postop Assessment: no headache, epidural receding, patient able to bend at knees and no signs of nausea or vomiting Anesthetic complications: no Comments: Pt. States she is walking.  Pain score 0.     No complications documented.  Last Vitals:  Vitals:   03/12/20 0008 03/12/20 0424  BP: (!) 118/94 127/77  Pulse: 100 86  Resp: 17 18  Temp: 37.2 C 36.8 C  SpO2: 99% 98%    Last Pain:  Vitals:   03/12/20 0731  TempSrc:   PainSc: 0-No pain   Pain Goal:                Epidural/Spinal Function Cutaneous sensation: Normal sensation (03/12/20 0731), Patient able to flex knees: Yes (03/12/20 0731), Patient able to lift hips off bed: Yes (03/12/20 0731), Back pain beyond tenderness at insertion site: No (03/12/20 0731), Progressively worsening motor and/or sensory loss: No (03/12/20 0731), Bowel and/or bladder incontinence post epidural: No (03/12/20 0731)  Merrilyn Puma

## 2020-03-12 NOTE — Anesthesia Preprocedure Evaluation (Addendum)
Anesthesia Evaluation  Patient identified by MRN, date of birth, ID band Patient awake    Reviewed: Allergy & Precautions, H&P , NPO status , Patient's Chart, lab work & pertinent test results  Airway Mallampati: II  TM Distance: >3 FB Neck ROM: Full    Dental no notable dental hx. (+) Teeth Intact, Dental Advisory Given   Pulmonary neg pulmonary ROS,    Pulmonary exam normal breath sounds clear to auscultation       Cardiovascular negative cardio ROS Normal cardiovascular exam Rhythm:Regular Rate:Normal     Neuro/Psych  Neuromuscular disease (right sided sciatica and back pain) negative psych ROS   GI/Hepatic negative GI ROS, Neg liver ROS,   Endo/Other  negative endocrine ROS  Renal/GU negative Renal ROS  negative genitourinary   Musculoskeletal negative musculoskeletal ROS (+)   Abdominal (+) + obese,   Peds negative pediatric ROS (+)  Hematology  (+) anemia ,   Anesthesia Other Findings   Reproductive/Obstetrics negative OB ROS                            Anesthesia Physical  Anesthesia Plan  ASA: III  Anesthesia Plan: Epidural   Post-op Pain Management:    Induction:   PONV Risk Score and Plan: 2 and Treatment may vary due to age or medical condition and Ondansetron  Airway Management Planned: Natural Airway  Additional Equipment:   Intra-op Plan:   Post-operative Plan:   Informed Consent: I have reviewed the patients History and Physical, chart, labs and discussed the procedure including the risks, benefits and alternatives for the proposed anesthesia with the patient or authorized representative who has indicated his/her understanding and acceptance.       Plan Discussed with: Anesthesiologist  Anesthesia Plan Comments:         Anesthesia Quick Evaluation

## 2020-03-13 ENCOUNTER — Encounter: Payer: Self-pay | Admitting: Family Medicine

## 2020-03-13 MED ORDER — VITAMIN C 250 MG PO TABS: 250.0000 mg | ORAL_TABLET | ORAL | Status: DC

## 2020-03-13 MED ORDER — FERROUS SULFATE 325 (65 FE) MG PO TABS: 325.0000 mg | ORAL_TABLET | ORAL | 3 refills | Status: AC

## 2020-03-13 MED ORDER — IBUPROFEN 600 MG PO TABS: 600.0000 mg | ORAL_TABLET | Freq: Four times a day (QID) | ORAL | 0 refills | Status: AC | PRN

## 2020-03-13 MED ORDER — ACETAMINOPHEN 500 MG PO TABS: 1000.0000 mg | ORAL_TABLET | Freq: Four times a day (QID) | ORAL | Status: DC | PRN

## 2020-03-13 NOTE — Discharge Instructions (Signed)
-take tylenol 1000 mg every 6 hours as needed for pain, alternate with ibuprofen 600 mg every 6 hours -take iron pills every other day with vitamin c, this will help healing   Postpartum Care After Vaginal Delivery The following information offers guidance about how to care for yourself from the time you deliver your baby to 6-12 weeks after delivery (postpartum period). If you have problems or questions, contact your health care provider for more specific instructions. Follow these instructions at home: Vaginal bleeding  It is normal to have vaginal bleeding (lochia) after delivery. Wear a sanitary pad for bleeding and discharge. ? During the first week after delivery, the amount and appearance of lochia is often similar to a menstrual period. ? Over the next few weeks, it will gradually decrease to a dry, yellow-brown discharge. ? For most women, lochia stops completely by 4-6 weeks after delivery, but can vary.  Change your sanitary pads frequently. Watch for any changes in your flow, such as: ? A sudden increase in volume. ? A change in color. ? Large blood clots.  If you pass a blood clot from your vagina, save it and call your health care provider. Do not flush blood clots down the toilet before talking with your health care provider.  Do not use tampons or douches until your health care provider approves.  If you are not breastfeeding, your period should return 6-8 weeks after delivery. If you are feeding your baby breast milk only, your period may not return until you stop breastfeeding. Perineal care  Keep the area between the vagina and the anus (perineum) clean and dry. Use medicated pads and pain-relieving sprays and creams as directed.  If you had a surgical cut in the perineum (episiotomy) or a tear, check the area for signs of infection until you are healed. Check for: ? More redness, swelling, or pain. ? Fluid or blood coming from the cut or tear. ? Warmth. ? Pus or a  bad smell.  You may be given a squirt bottle to use instead of wiping to clean the perineum area after you use the bathroom. Pat the area gently to dry it.  To relieve pain caused by an episiotomy, a tear, or swollen veins in the anus (hemorrhoids), take a warm sitz bath 2-3 times a day. In a sitz bath, the warm water should only come up to your hips and cover your buttocks.   Breast care  In the first few days after delivery, your breasts may feel heavy, full, and uncomfortable (breast engorgement). Milk may also leak from your breasts. Ask your health care provider about ways to help relieve the discomfort.  If you are breastfeeding: ? Wear a bra that supports your breasts and fits well. Use breast pads to absorb milk that leaks. ? Keep your nipples clean and dry. Apply creams and ointments as told. ? You may have uterine contractions every time you breastfeed for up to several weeks after delivery. This helps your uterus return to its normal size. ? If you have any problems with breastfeeding, notify your health care provider or lactation consultant.  If you are not breastfeeding: ? Avoid touching your breasts. Do not squeeze out (express) milk. Doing this can make your breasts produce more milk. ? Wear a good-fitting bra and use cold packs to help with swelling. Intimacy and sexuality  Ask your health care provider when you can engage in sexual activity. This may depend upon: ? Your risk of infection. ?  How fast you are healing. ? Your comfort and desire to engage in sexual activity.  You are able to get pregnant after delivery, even if you have not had your period. Talk with your health care provider about methods of birth control (contraception) or family planning if you desire future pregnancies. Medicines  Take over-the-counter and prescription medicines only as told by your health care provider.  Take an over-the-counter stool softener to help ease bowel movements as told by  your health care provider.  If you were prescribed an antibiotic medicine, take it as told by your health care provider. Do not stop taking the antibiotic even if you start to feel better.  Review all previous and current prescriptions to check for possible transfer into breast milk. Activity  Gradually return to your normal activities as told by your health care provider.  Rest as much as possible. Nap while your baby is sleeping. Eating and drinking  Drink enough fluid to keep your urine pale yellow.  To help prevent or relieve constipation, eat high-fiber foods every day.  Choose healthy eating to support breastfeeding or weight loss goals.  Take your prenatal vitamins until your health care provider tells you to stop.   General tips/recommendations  Do not use any products that contain nicotine or tobacco. These products include cigarettes, chewing tobacco, and vaping devices, such as e-cigarettes. If you need help quitting, ask your health care provider.  Do not drink alcohol, especially if you are breastfeeding.  Do not take medications or drugs that are not prescribed to you, especially if you are breastfeeding.  Visit your health care provider for a postpartum checkup within the first 3-6 weeks after delivery.  Complete a comprehensive postpartum visit no later than 12 weeks after delivery.  Keep all follow-up visits for you and your baby. Contact a health care provider if:  You feel unusually sad or worried.  Your breasts become red, painful, or hard.  You have a fever or other signs of an infection.  You have bleeding that is soaking through one pad an hour or you have blood clots.  You have a severe headache that doesn't go away or you have vision changes.  You have nausea and vomiting and are unable to eat or drink anything for 24 hours. Get help right away if:  You have chest pain or difficulty breathing.  You have sudden, severe leg pain.  You faint or  have a seizure.  You have thoughts about hurting yourself or your baby. If you ever feel like you may hurt yourself or others, or have thoughts about taking your own life, get help right away. Go to your nearest emergency department or:  Call your local emergency services (911 in the [place]  The National Suicide Prevention Lifeline at 315-789-9530. This suicide crisis helpline is open 24 hours a day.  Text the Crisis Text Line at 202-751-6271 (in the [place] Summary  The period of time after you deliver your newborn up to 6-12 weeks after delivery is called the postpartum period.  Keep all follow-up visits for you and your baby.  Review all previous and current prescriptions to check for possible transfer into breast milk.  Contact a health care provider if you feel unusually sad or worried during the postpartum period. This information is not intended to replace advice given to you by your health care provider. Make sure you discuss any questions you have with your health care provider. Document Revised: 09/30/2019 Document Reviewed: 09/30/2019  Elsevier Patient Education  2021 Elsevier Inc.  

## 2020-03-14 LAB — SURGICAL PATHOLOGY

## 2020-03-30 ENCOUNTER — Other Ambulatory Visit: Payer: Self-pay

## 2020-03-30 ENCOUNTER — Emergency Department (HOSPITAL_COMMUNITY)
Admission: EM | Admit: 2020-03-30 | Discharge: 2020-03-31 | Disposition: A | Discharge status: Home / Self Care | Payer: Medicaid Other | Attending: Emergency Medicine | Admitting: Emergency Medicine

## 2020-03-30 ENCOUNTER — Encounter (HOSPITAL_COMMUNITY): Payer: Self-pay | Admitting: Emergency Medicine

## 2020-03-30 DIAGNOSIS — Z5321 Procedure and treatment not carried out due to patient leaving prior to being seen by health care provider: Secondary | ICD-10-CM | POA: Insufficient documentation

## 2020-03-30 DIAGNOSIS — R300 Dysuria: Secondary | ICD-10-CM | POA: Diagnosis not present

## 2020-03-30 LAB — URINALYSIS, ROUTINE W REFLEX MICROSCOPIC
Bilirubin Urine: NEGATIVE
Glucose, UA: NEGATIVE mg/dL
Ketones, ur: NEGATIVE mg/dL
Nitrite: POSITIVE — AB
Protein, ur: 30 mg/dL — AB
Specific Gravity, Urine: 1.013 (ref 1.005–1.030)
WBC, UA: >50 WBC/hpf — ABNORMAL HIGH (ref 0–5)
pH: 6 (ref 5.0–8.0)

## 2020-03-30 LAB — BASIC METABOLIC PANEL
Anion gap: 12 (ref 5–15)
BUN: 13 mg/dL (ref 6–20)
CO2: 21 mmol/L — ABNORMAL LOW (ref 22–32)
Calcium: 9 mg/dL (ref 8.9–10.3)
Chloride: 104 mmol/L (ref 98–111)
Creatinine, Ser: 1.02 mg/dL — ABNORMAL HIGH (ref 0.44–1.00)
GFR, Estimated: >60 mL/min (ref >60)
Glucose, Bld: 93 mg/dL (ref 70–99)
Potassium: 3.5 mmol/L (ref 3.5–5.1)
Sodium: 137 mmol/L (ref 135–145)

## 2020-03-30 LAB — CBC WITH DIFFERENTIAL/PLATELET
Abs Immature Granulocytes: 0.08 10*3/uL — ABNORMAL HIGH (ref 0.00–0.07)
Basophils Absolute: 0 10*3/uL (ref 0.0–0.1)
Basophils Relative: 0 %
Eosinophils Absolute: 0 10*3/uL (ref 0.0–0.5)
Eosinophils Relative: 0 %
HCT: 38.6 % (ref 36.0–46.0)
Hemoglobin: 11.4 g/dL — ABNORMAL LOW (ref 12.0–15.0)
Immature Granulocytes: 1 %
Lymphocytes Relative: 9 %
Lymphs Abs: 1.3 10*3/uL (ref 0.7–4.0)
MCH: 25.3 pg — ABNORMAL LOW (ref 26.0–34.0)
MCHC: 29.5 g/dL — ABNORMAL LOW (ref 30.0–36.0)
MCV: 85.8 fL (ref 80.0–100.0)
Monocytes Absolute: 1.1 10*3/uL — ABNORMAL HIGH (ref 0.1–1.0)
Monocytes Relative: 7 %
Neutro Abs: 12.4 10*3/uL — ABNORMAL HIGH (ref 1.7–7.7)
Neutrophils Relative %: 83 %
Platelets: 405 10*3/uL — ABNORMAL HIGH (ref 150–400)
RBC: 4.5 MIL/uL (ref 3.87–5.11)
RDW: 19.6 % — ABNORMAL HIGH (ref 11.5–15.5)
WBC: 14.9 10*3/uL — ABNORMAL HIGH (ref 4.0–10.5)
nRBC: 0 % (ref 0.0–0.2)

## 2020-03-30 LAB — I-STAT BETA HCG BLOOD, ED (MC, WL, AP ONLY): I-stat hCG, quantitative: <5 m[IU]/mL (ref <5)

## 2020-03-30 MED ORDER — ACETAMINOPHEN 325 MG PO TABS
650.0000 mg | ORAL_TABLET | Freq: Once | ORAL | Status: AC
  Administered 2020-03-30: 650 mg via ORAL
  Filled 2020-03-30: qty 2

## 2020-03-30 NOTE — ED Triage Notes (Addendum)
Patient reports dysuria onset last week , no hematuria , denies emesis or chills . Febrile at triage . HR=135 at arrival.

## 2020-03-31 ENCOUNTER — Telehealth: Payer: Self-pay

## 2020-03-31 MED ORDER — NITROFURANTOIN MONOHYD MACRO 100 MG PO CAPS: 100.0000 mg | ORAL_CAPSULE | Freq: Two times a day (BID) | ORAL | 0 refills | Status: DC

## 2020-03-31 MED ORDER — PHENAZOPYRIDINE HCL 200 MG PO TABS: 200.0000 mg | ORAL_TABLET | Freq: Three times a day (TID) | ORAL | 0 refills | Status: DC | PRN

## 2020-03-31 NOTE — Telephone Encounter (Signed)
Pt c/o dysuria x 1 week and febrile.  No available appts today Pt is not breastfeeding.  Macrobid and Pyridium sent to pharmacy per protocol Pt to contact the office or report to Urgent Care after hours, if symptoms worsens or do not improve.

## 2020-03-31 NOTE — ED Notes (Signed)
Pt did not respond when called for vitals check again

## 2020-03-31 NOTE — ED Notes (Signed)
Pt did not respond when called for vitals check 

## 2020-04-17 ENCOUNTER — Ambulatory Visit (INDEPENDENT_AMBULATORY_CARE_PROVIDER_SITE_OTHER): Payer: Medicaid Other | Admitting: Obstetrics & Gynecology

## 2020-04-17 ENCOUNTER — Other Ambulatory Visit: Payer: Self-pay

## 2020-04-17 ENCOUNTER — Encounter: Payer: Self-pay | Admitting: Obstetrics & Gynecology

## 2020-04-17 VITALS — BP 112/72 | HR 103 | Ht 63.0 in | Wt 182.0 lb

## 2020-04-17 DIAGNOSIS — Z9079 Acquired absence of other genital organ(s): Secondary | ICD-10-CM

## 2020-04-17 NOTE — Progress Notes (Signed)
    Post Partum Visit Note  Kristina Goodwin is a 30 y.o. [phone] female who presents for a postpartum visit. She is 5 weeks postpartum following a normal spontaneous vaginal delivery.  I have fully reviewed the prenatal and intrapartum course. The delivery was at 40 gestational weeks.  Anesthesia: epidural. Postpartum course has been unremarkable. Baby is doing well. Baby is feeding by [employer] . Bleeding staining only. Bowel function is normal. Bladder function is normal. Patient is sexually active. Contraception method is tubal ligation. Postpartum depression screening: negative. EPDS = 1   The pregnancy intention screening data noted above was reviewed. Potential methods of contraception were discussed. The patient elected to proceed with Female Sterilization.      The following portions of the patient's history were reviewed and updated as appropriate: allergies, current medications, past family history, past medical history, past social history, past surgical history and problem list.  Review of Systems Pertinent items are noted in HPI.    Objective:  LMP 03/23/2020   Blood pressure 112/72, pulse (!) 103, height 5\' 3"  (1.6 m), weight 182 lb (82.6 kg), last menstrual period 03/23/2020, unknown if currently breastfeeding.  General:  alert, cooperative and no distress   Breasts:    Lungs:       Abdomen: soft, non-tender; bowel sounds normal; no masses,  no organomegaly and incision D&I   Vulva:  not evaluated  Vagina: not evaluated                    Assessment:    normal postpartum exam. Pap smear not done at today's visit.   Plan:   Essential components of care per ACOG recommendations:  1.  Mood and well being: Patient with negative depression screening today. Reviewed local resources for support.  - Patient does not use tobacco. - hx of drug use? No    2. Infant care and feeding:  -Patient currently breastmilk feeding? No-Social determinants of health (SDOH)  reviewed in EPIC. No concerns  3. Sexuality, contraception and birth spacing - Patient does not want a pregnancy in the next year.  Desired family size is 4 children.  - Reviewed forms of contraception in tiered fashion. Patient desired bilateral tubal ligation today.   - Discussed birth spacing of 18 months  4. Sleep and fatigue -Encouraged family/partner/community support of 4 hrs of uninterrupted sleep to help with mood and fatigue  5. Physical Recovery  - Discussed patients delivery - Patient had a 0 degree laceration, perineal healing reviewed. Patient expressed understanding - Patient has urinary incontinence? No  - Patient is safe to resume physical and sexual activity  6.  Health Maintenance - Last pap smear done 10/14/2019 and was normal with negative HPV.   7. No Chronic Disease - PCP follow up  10/16/2019, MD [facility]

## 2020-04-17 NOTE — Patient Instructions (Signed)

## 2020-06-07 ENCOUNTER — Ambulatory Visit
Admission: EM | Admit: 2020-06-07 | Discharge: 2020-06-07 | Disposition: A | Discharge status: Home / Self Care | Payer: Medicaid Other | Attending: Family Medicine | Admitting: Family Medicine

## 2020-06-07 ENCOUNTER — Encounter: Payer: Self-pay | Admitting: Emergency Medicine

## 2020-06-07 DIAGNOSIS — Z711 Person with feared health complaint in whom no diagnosis is made: Secondary | ICD-10-CM

## 2020-06-07 LAB — POCT URINALYSIS DIP (MANUAL ENTRY)
Bilirubin, UA: NEGATIVE
Glucose, UA: NEGATIVE mg/dL
Ketones, POC UA: NEGATIVE mg/dL
Leukocytes, UA: NEGATIVE
Nitrite, UA: NEGATIVE
Spec Grav, UA: 1.025 (ref 1.010–1.025)
Urobilinogen, UA: 0.2 E.U./dL
pH, UA: 6 (ref 5.0–8.0)

## 2020-06-07 LAB — POCT URINE PREGNANCY: Preg Test, Ur: NEGATIVE

## 2020-06-07 NOTE — ED Triage Notes (Signed)
Patient states that her boyfriend was diagnosed with UTI and she would like to be checked for STD's.  No discharge, no urinary sx's.

## 2020-06-07 NOTE — ED Provider Notes (Addendum)
[facility]    CSN: [id number] Arrival date & time: 06/07/20  0933      History   Chief Complaint Chief Complaint  Patient presents with  . SEXUALLY TRANSMITTED DISEASE    HPI Kristina Goodwin is a 30 y.o. female.   HPI  Patient presents today for STD testing.  She reports that her recent sexual partners at the ER being treated for a"UTI" for which she is actually concerned that he has been treated for an STD however has no knowledge of being positive STD results.  He was tested for STDs and told her that he would have his results sometimes today.  She is asymptomatic.  Last menstrual period 06/05/2020  Past Medical History:  Diagnosis Date  . Gonorrhea   . H/O varicella   . Hx of chlamydia infection   . Medical history non-contributory   . Trichomonas     Patient Active Problem List   Diagnosis Date Noted  . H/O bilateral salpingectomy     Past Surgical History:  Procedure Laterality Date  . NO PAST SURGERIES    . TUBAL LIGATION N/A 03/12/2020   Procedure: POST PARTUM TUBAL LIGATION;  Surgeon: Donnamae Jude, MD;  Location: [facility] LD ORS;  Service: Gynecology;  Laterality: N/A;    OB History    Gravida  5   Para  4   Term  4   Preterm  0   AB  1   Living  4     SAB  1   IAB  0   Ectopic  0   Multiple  0   Live Births  4            Home Medications    Prior to Admission medications   Medication Sig Start Date End Date Taking? Authorizing Provider  acetaminophen (TYLENOL) 500 MG tablet Take 2 tablets (1,000 mg total) by mouth every 6 (six) hours as needed. Patient not taking: Reported on 04/17/2020 3/64/68   Arrie Senate, MD  Blood Pressure Monitoring (BLOOD PRESSURE KIT) DEVI 1 Device by Does not apply route as needed. Patient not taking: Reported on 04/17/2020 10/14/19   Shelly Bombard, MD  ferrous sulfate 325 (65 FE) MG tablet Take 1 tablet (325 mg total) by mouth every other day. Patient not taking: Reported on  04/17/2020 0/32/12   Arrie Senate, MD  ibuprofen (ADVIL) 600 MG tablet Take 1 tablet (600 mg total) by mouth every 6 (six) hours as needed. Patient not taking: Reported on 04/17/2020 2/48/25   Arrie Senate, MD  nitrofurantoin, macrocrystal-monohydrate, (MACROBID) 100 MG capsule Take 1 capsule (100 mg total) by mouth 2 (two) times daily. Patient not taking: Reported on 04/17/2020 03/31/20   Sloan Leiter, MD  phenazopyridine (PYRIDIUM) 200 MG tablet Take 1 tablet (200 mg total) by mouth 3 (three) times daily as needed for pain (urethral spasm). Patient not taking: Reported on 04/17/2020 03/31/20   Sloan Leiter, MD  vitamin C (ASCORBIC ACID) 250 MG tablet Take 1 tablet (250 mg total) by mouth every other day. Take with iron Patient not taking: Reported on 04/17/2020 0/03/70   Arrie Senate, MD    Family History Family History  Problem Relation Age of Onset  . Asthma Father   . Asthma Brother   . Diabetes Maternal Uncle   . Hypertension Maternal Uncle   . Diabetes Maternal Grandmother   . Thyroid disease Cousin   . Vision loss Mother  Glaucoma  . Seizures Mother   . Asthma Mother     Social History Social History   Tobacco Use  . Smoking status: Passive Smoke Exposure - Never Smoker  . Smokeless tobacco: Never Used  Vaping Use  . Vaping Use: Never used  Substance Use Topics  . Alcohol use: No  . Drug use: Not Currently    Frequency: 5.0 times per week    Types: Marijuana    Comment: last smoked 03/17/2019     Allergies   Patient has no known allergies.   Review of Systems Review of Systems Pertinent negatives listed in HPI   Physical Exam Triage Vital Signs ED Triage Vitals  Enc Vitals Group     BP 06/07/20 1016 126/82     Pulse Rate 06/07/20 1016 79     Resp --      Temp 06/07/20 1016 97.9 F (36.6 C)     Temp Source 06/07/20 1016 Oral     SpO2 06/07/20 1016 98 %     Weight --      Height --      Head Circumference --      Peak Flow  --      Pain Score 06/07/20 1018 0     Pain Loc --      Pain Edu? --      Excl. in [place]? --    No data found.  Updated Vital Signs BP 126/82 (BP Location: Left Arm)   Pulse 79   Temp 97.9 F (36.6 C) (Oral)   LMP 06/05/2020   SpO2 98%   Visual Acuity Right Eye Distance:   Left Eye Distance:   Bilateral Distance:    Right Eye Near:   Left Eye Near:    Bilateral Near:     Physical Exam General appearance: alert, well developed, well nourished, cooperative  Head: Normocephalic, without obvious abnormality, atraumatic Respiratory: Respirations even and unlabored, normal respiratory rate Heart: Rate and rhythm normal.  Extremities: No gross deformities Skin: Skin color, texture, turgor normal. No rashes seen  Psych: Appropriate mood and affect. Neurologic: GCS 15, normal coordination, normal gait  Vaginal cytology self collected UC Treatments / Results  Labs (all labs ordered are listed, but only abnormal results are displayed) Labs Reviewed  POCT URINALYSIS DIP (MANUAL ENTRY) - Abnormal; Notable for the following components:      Result Value   Blood, UA large (*)    Protein Ur, POC trace (*)    All other components within normal limits  POCT URINE PREGNANCY  CERVICOVAGINAL ANCILLARY ONLY    EKG   Radiology No results found.  Procedures Procedures (including critical care time)  Medications Ordered in UC Medications - No data to display  Initial Impression / Assessment and Plan / UC Course  I have reviewed the triage vital signs and the nursing notes.  Pertinent labs & imaging results that were available during my care of the patient were reviewed by me and considered in my medical decision making (see chart for details).      Vaginal cytology pending hCG urine negative.  UA unremarkable. Defer treatment until cytology results. Final Clinical Impressions(s) / UC Diagnoses   Final diagnoses:  Concern about STD in female without diagnosis   Discharge  Instructions   None    ED Prescriptions    None     PDMP not reviewed this encounter.   Scot Jun, FNP 06/07/20 1045    Scot Jun, FNP  06/07/20 1045  

## 2020-06-08 LAB — CERVICOVAGINAL ANCILLARY ONLY
Bacterial Vaginitis (gardnerella): NEGATIVE
Candida Glabrata: NEGATIVE
Candida Vaginitis: NEGATIVE
Chlamydia: NEGATIVE
Comment: NEGATIVE
Comment: NEGATIVE
Comment: NEGATIVE
Comment: NEGATIVE
Comment: NEGATIVE
Comment: NORMAL
Neisseria Gonorrhea: NEGATIVE
Trichomonas: POSITIVE — AB

## 2020-06-09 ENCOUNTER — Telehealth (HOSPITAL_COMMUNITY): Payer: Self-pay | Admitting: Emergency Medicine

## 2020-06-09 MED ORDER — METRONIDAZOLE 500 MG PO TABS: 500.0000 mg | ORAL_TABLET | Freq: Two times a day (BID) | ORAL | 0 refills | Status: DC

## 2020-09-18 IMAGING — US US OB < 14 WEEKS - US OB TV
1 series · 15 of 28 positions shown · non-contrast
Comparison: None.

CLINICAL DATA: Vaginal bleeding, positive urine pregnancy test

EXAM:
OBSTETRIC <14 WK US AND TRANSVAGINAL OB US
TECHNIQUE: Both transabdominal and transvaginal ultrasound examinations were
performed for complete evaluation of the gestation as well as the
maternal uterus, adnexal regions, and pelvic cul-de-sac.
Transvaginal technique was performed to assess early pregnancy.

[Series 1: us ob < 14 weeks - us ob tv · 15 of 72 slices shown]
[im 1/72]
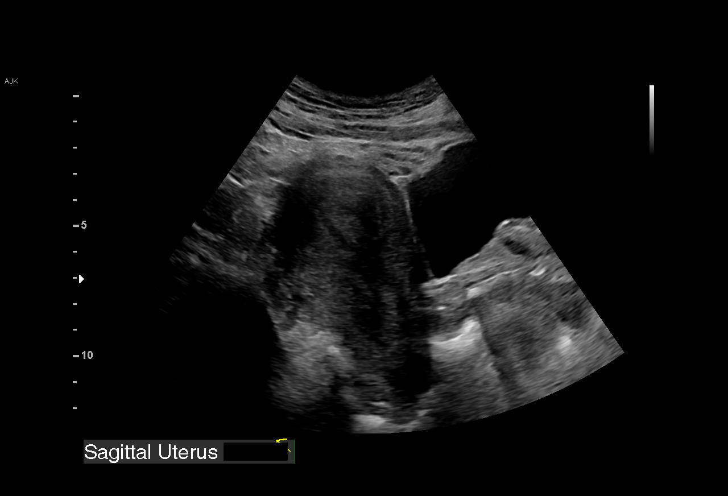
[im 6/72]
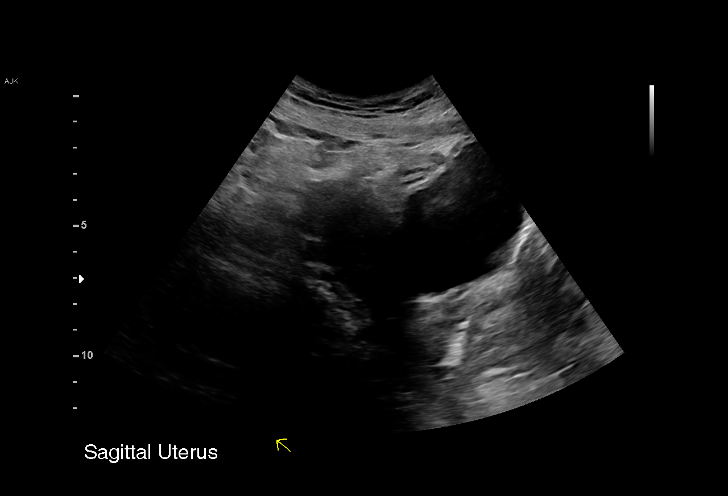
[im 11/72]
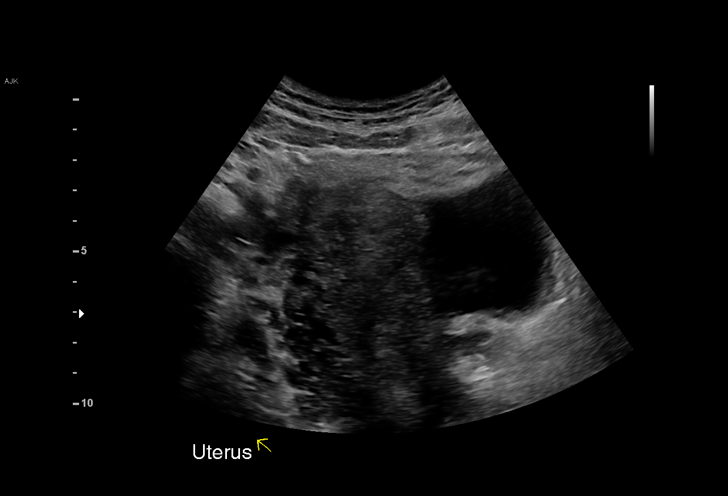
[im 16/72]
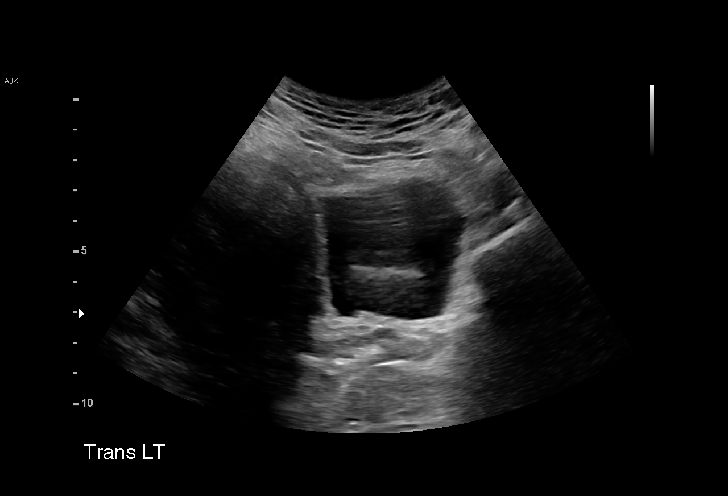
[im 22/72]
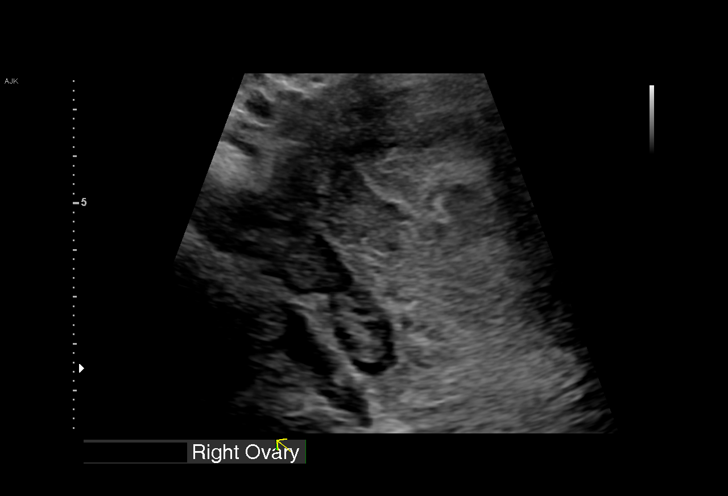
[im 27/72]
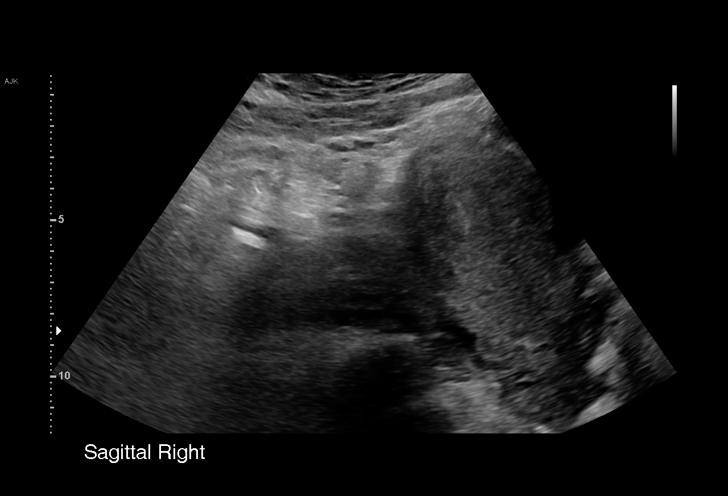
[im 32/72]
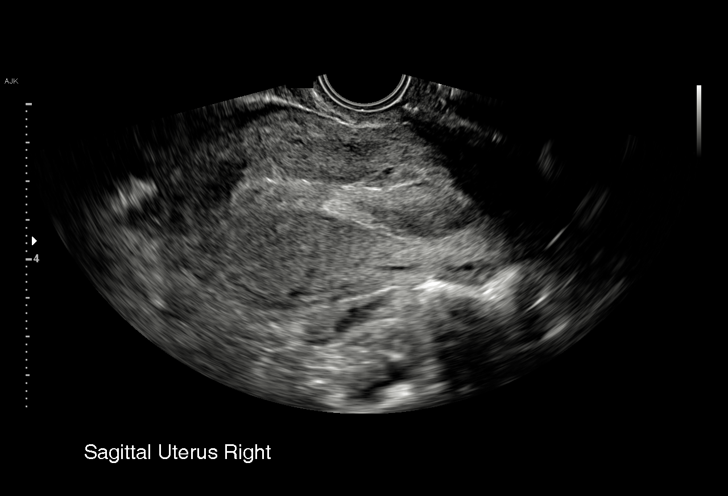
[im 37/72]
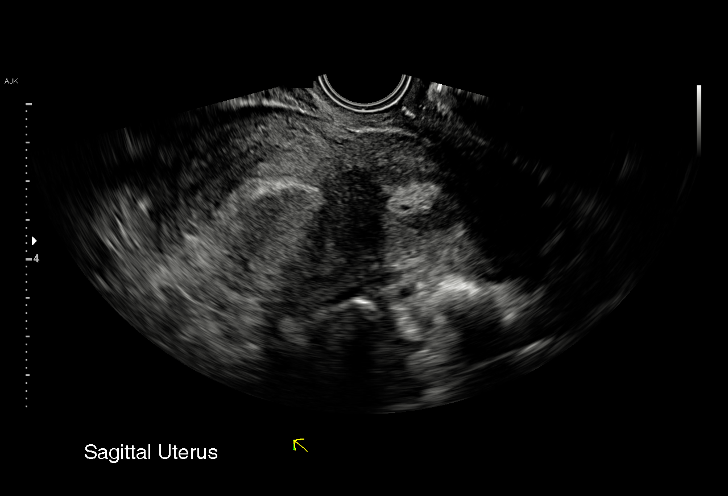
[im 40/72]
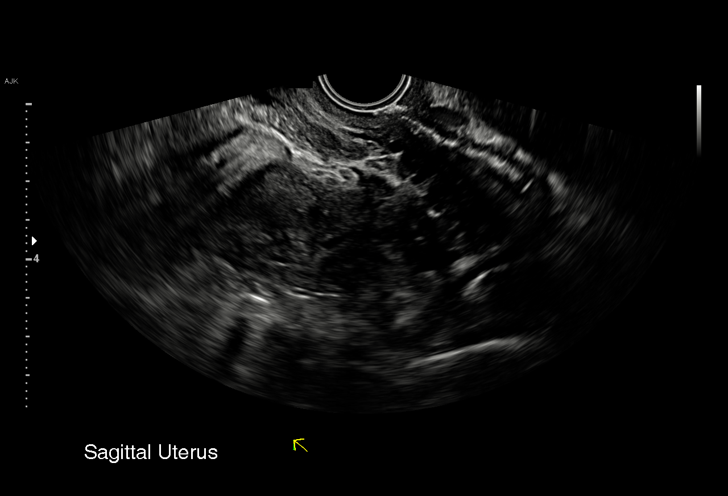
[im 45/72]
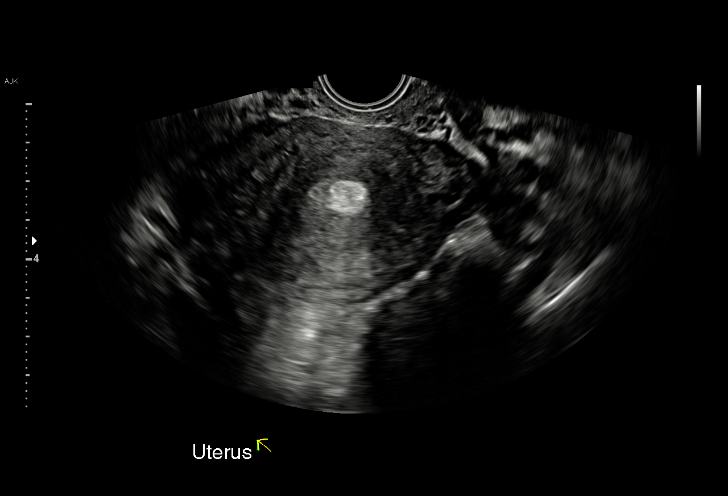
[im 50/72]
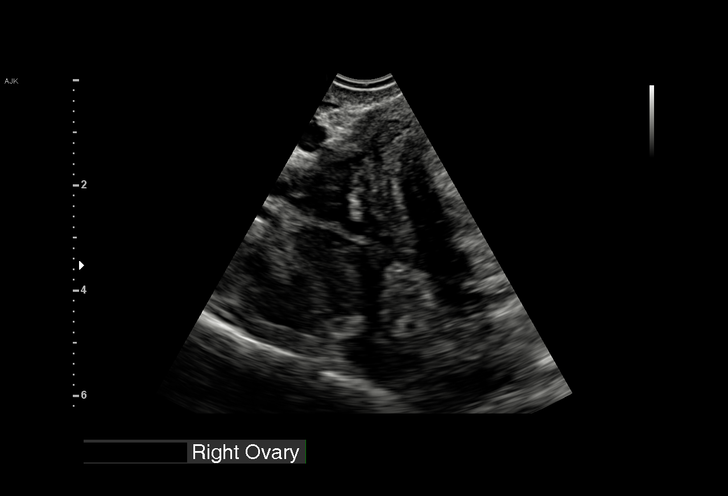
[im 56/72]
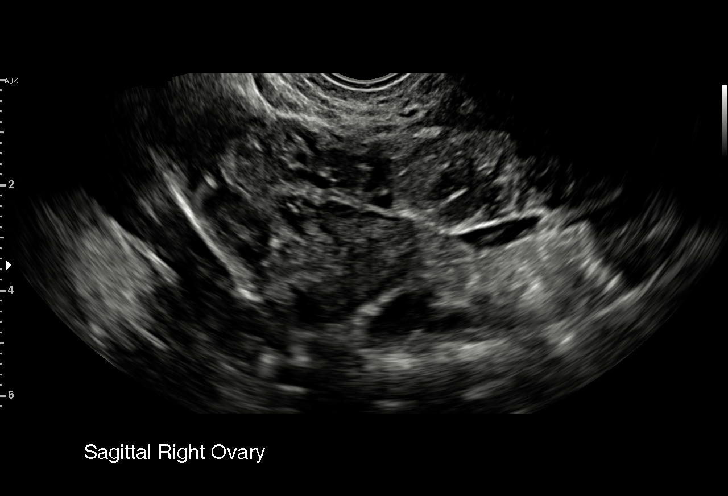
[im 61/72]
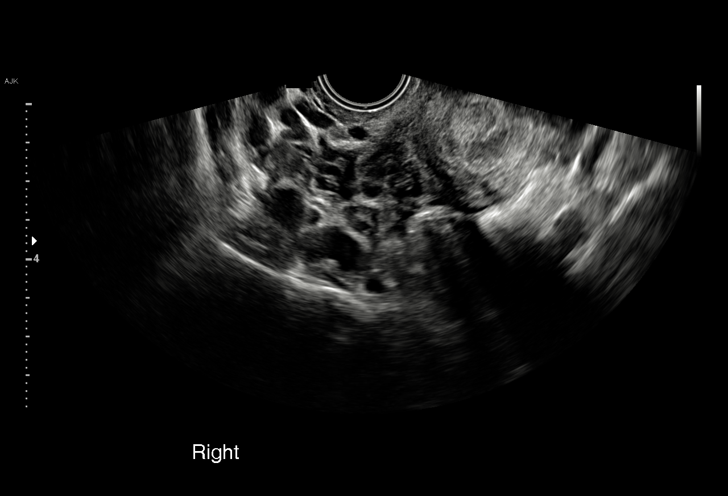
[im 66/72]
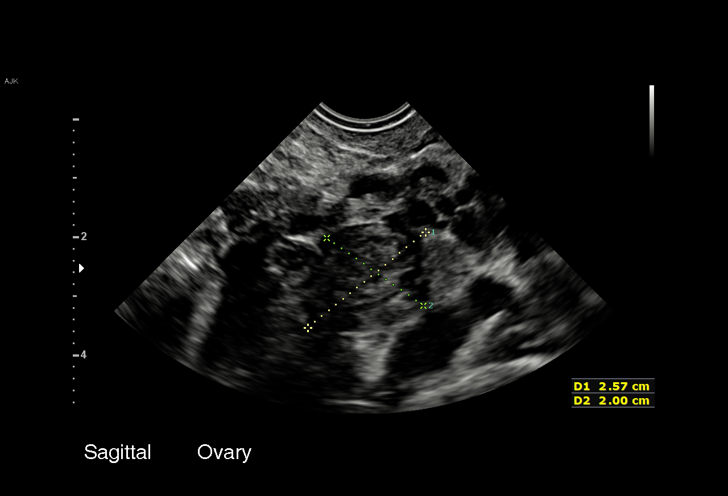
[im 72/72]
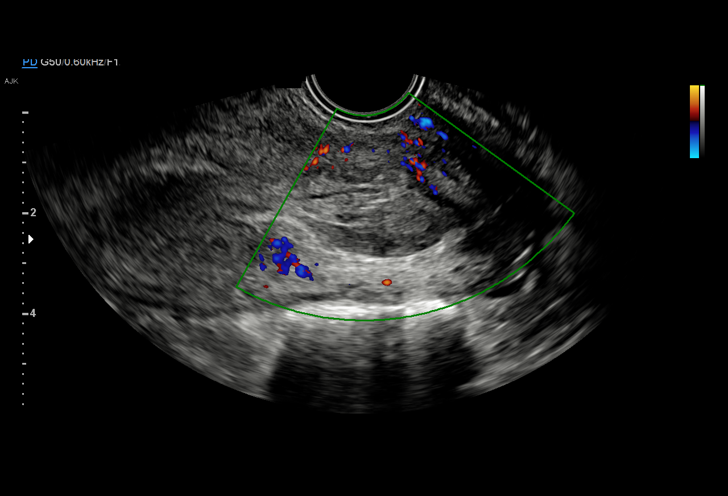

[15 of 28 positions shown; findings below may reference images not displayed]

FINDINGS: Intrauterine gestational sac: None

Yolk sac:  Not Visualized.

Embryo:  Not Visualized.

Cardiac Activity: Not Visualized.

Maternal uterus/adnexae: Heterogeneous material within the lower
uterine segment with possible central hypoechoic region, although no
definite cystic or fluid space. No internal vascularity. Ovaries are
unremarkable. No adnexal masses are seen. No free fluid in the
cul-de-sac.
IMPRESSION: 1. No intrauterine pregnancy identified. Heterogeneous material
within the lower uterine segment without a definable gestational
sac. Recommend close clinical follow-up, serial quantitative B-HCG
levels, and short-term follow-up ultrasound.
2. No adnexal masses or free fluid.

## 2021-09-17 ENCOUNTER — Ambulatory Visit: Payer: Medicaid Other | Admitting: Family Medicine

## 2021-10-04 ENCOUNTER — Telehealth: Payer: Medicaid Other | Admitting: Physician Assistant

## 2021-10-04 DIAGNOSIS — R3989 Other symptoms and signs involving the genitourinary system: Secondary | ICD-10-CM | POA: Diagnosis not present

## 2021-10-04 MED ORDER — CEPHALEXIN 500 MG PO CAPS: 500.0000 mg | ORAL_CAPSULE | Freq: Two times a day (BID) | ORAL | 0 refills | Status: AC

## 2021-10-04 NOTE — Progress Notes (Signed)
I have spent 5 minutes in review of e-visit questionnaire, review and updating patient chart, medical decision making and response to patient.   Kristina [place]gan Cody Criss Bartles, PA-C    

## 2021-10-04 NOTE — Progress Notes (Signed)

## 2021-10-16 ENCOUNTER — Telehealth: Payer: Medicaid Other | Admitting: Physician Assistant

## 2021-10-16 DIAGNOSIS — J029 Acute pharyngitis, unspecified: Secondary | ICD-10-CM

## 2021-10-16 DIAGNOSIS — J069 Acute upper respiratory infection, unspecified: Secondary | ICD-10-CM

## 2021-10-16 MED ORDER — BENZONATATE 100 MG PO CAPS: 100.0000 mg | ORAL_CAPSULE | Freq: Three times a day (TID) | ORAL | 0 refills | Status: DC | PRN

## 2021-10-16 NOTE — Progress Notes (Signed)

## 2021-10-16 NOTE — Progress Notes (Signed)
I have spent 5 minutes in review of e-visit questionnaire, review and updating patient chart, medical decision making and response to patient.   Markavious Micco Cody Diantha Paxson, PA-C    

## 2021-10-16 NOTE — Progress Notes (Signed)
Message sent to patient requesting further input regarding current symptoms. Awaiting patient response.  

## 2021-10-22 ENCOUNTER — Telehealth: Payer: Medicaid Other | Admitting: Physician Assistant

## 2021-10-22 DIAGNOSIS — R3989 Other symptoms and signs involving the genitourinary system: Secondary | ICD-10-CM

## 2021-10-22 MED ORDER — SULFAMETHOXAZOLE-TRIMETHOPRIM 800-160 MG PO TABS: 1.0000 | ORAL_TABLET | Freq: Two times a day (BID) | ORAL | 0 refills | Status: DC

## 2021-10-22 NOTE — Progress Notes (Signed)
Virtual Visit Consent   Kristina Goodwin, you are scheduled for a virtual visit with a [facility] provider today. Just as with appointments in the office, your consent must be obtained to participate. Your consent will be active for this visit and any virtual visit you may have with one of our providers in the next 365 days. If you have a MyChart account, a copy of this consent can be sent to you electronically.  As this is a virtual visit, video technology does not allow for your provider to perform a traditional examination. This may limit your provider's ability to fully assess your condition. If your provider identifies any concerns that need to be evaluated in person or the need to arrange testing (such as labs, EKG, etc.), we will make arrangements to do so. Although advances in technology are sophisticated, we cannot ensure that it will always work on either your end or our end. If the connection with a video visit is poor, the visit may have to be switched to a telephone visit. With either a video or telephone visit, we are not always able to ensure that we have a secure connection.  By engaging in this virtual visit, you consent to the provision of healthcare and authorize for your insurance to be billed (if applicable) for the services provided during this visit. Depending on your insurance coverage, you may receive a charge related to this service.  I need to obtain your verbal consent now. Are you willing to proceed with your visit today? Kristina Goodwin has provided verbal consent on 10/22/2021 for a virtual visit (video or telephone). Mar Daring, PA-C  Date: 10/22/2021 6:19 PM  Virtual Visit via Video Note   I, Mar Daring, connected with  Kristina Goodwin  ([id number], [birth year]) on 10/22/21 at  6:15 PM EDT by a video-enabled telemedicine application and verified that I am speaking with the correct person using two identifiers.  Location: Patient: Virtual Visit  Location Patient: Home Provider: Virtual Visit Location Provider: Home Office   I discussed the limitations of evaluation and management by telemedicine and the availability of in person appointments. The patient expressed understanding and agreed to proceed.    History of Present Illness: Kristina Goodwin is a 31 y.o. who identifies as a female who was assigned female at birth, and is being seen today for recurrent UTI. Last UTI was 10/04/21 and was treated successfully with Keflex 544m BID x 7 days.  HPI: Urinary Tract Infection  This is a recurrent problem. The current episode started today. The problem has been gradually worsening. The quality of the pain is described as aching and burning. The pain is mild. There has been no fever. Associated symptoms include frequency, hematuria (mild), hesitancy and urgency. Pertinent negatives include no chills, discharge, flank pain, nausea, sweats or vomiting. She has tried increased fluids for the symptoms. The treatment provided no relief. Her past medical history is significant for recurrent UTIs.      Problems:  Patient Active Problem List   Diagnosis Date Noted   H/O bilateral salpingectomy     Allergies: No Known Allergies Medications:  Current Outpatient Medications:    sulfamethoxazole-trimethoprim (BACTRIM DS) 800-160 MG tablet, Take 1 tablet by mouth 2 (two) times daily., Disp: 10 tablet, Rfl: 0   acetaminophen (TYLENOL) 500 MG tablet, Take 2 tablets (1,000 mg total) by mouth every 6 (six) hours as needed. (Patient not taking: Reported on 04/17/2020), Disp: , Rfl:  benzonatate (TESSALON) 100 MG capsule, Take 1 capsule (100 mg total) by mouth 3 (three) times daily as needed for cough., Disp: 30 capsule, Rfl: 0   Blood Pressure Monitoring (BLOOD PRESSURE KIT) DEVI, 1 Device by Does not apply route as needed. (Patient not taking: Reported on 04/17/2020), Disp: 1 each, Rfl: 0   ferrous sulfate 325 (65 FE) MG tablet, Take 1 tablet (325 mg  total) by mouth every other day. (Patient not taking: Reported on 04/17/2020), Disp: , Rfl: 3   ibuprofen (ADVIL) 600 MG tablet, Take 1 tablet (600 mg total) by mouth every 6 (six) hours as needed. (Patient not taking: Reported on 04/17/2020), Disp: 30 tablet, Rfl: 0   phenazopyridine (PYRIDIUM) 200 MG tablet, Take 1 tablet (200 mg total) by mouth 3 (three) times daily as needed for pain (urethral spasm). (Patient not taking: Reported on 04/17/2020), Disp: 10 tablet, Rfl: 0   vitamin C (ASCORBIC ACID) 250 MG tablet, Take 1 tablet (250 mg total) by mouth every other day. Take with iron (Patient not taking: Reported on 04/17/2020), Disp: , Rfl:   Observations/Objective: Patient is well-developed, well-nourished in no acute distress.  Resting comfortably at home.  Head is normocephalic, atraumatic.  No labored breathing.  Speech is clear and coherent with logical content.  Patient is alert and oriented at baseline.    Assessment and Plan: 1. Suspected UTI - sulfamethoxazole-trimethoprim (BACTRIM DS) 800-160 MG tablet; Take 1 tablet by mouth 2 (two) times daily.  Dispense: 10 tablet; Refill: 0  - Worsening symptoms.  - Will treat empirically with Bactrim - May use AZO for bladder spasms - Continue to push fluids.  - Seek in person evaluation for urine culture if symptoms do not improve or if they worsen.   Follow Up Instructions: I discussed the assessment and treatment plan with the patient. The patient was provided an opportunity to ask questions and all were answered. The patient agreed with the plan and demonstrated an understanding of the instructions.  A copy of instructions were sent to the patient via MyChart unless otherwise noted below.    The patient was advised to call back or seek an in-person evaluation if the symptoms worsen or if the condition fails to improve as anticipated.  Time:  I spent 8 minutes with the patient via telehealth technology discussing the above  problems/concerns.    Mar Daring, PA-C

## 2021-10-22 NOTE — Patient Instructions (Signed)
Kristina Goodwin, thank you for joining Mar Daring, PA-C for today's virtual visit.  While this provider is not your primary care provider (PCP), if your PCP is located in our provider database this encounter information will be shared with them immediately following your visit.  Consent: (Patient) Kristina Goodwin provided verbal consent for this virtual visit at the beginning of the encounter.  Current Medications:  Current Outpatient Medications:    sulfamethoxazole-trimethoprim (BACTRIM DS) 800-160 MG tablet, Take 1 tablet by mouth 2 (two) times daily., Disp: 10 tablet, Rfl: 0   acetaminophen (TYLENOL) 500 MG tablet, Take 2 tablets (1,000 mg total) by mouth every 6 (six) hours as needed. (Patient not taking: Reported on 04/17/2020), Disp: , Rfl:    benzonatate (TESSALON) 100 MG capsule, Take 1 capsule (100 mg total) by mouth 3 (three) times daily as needed for cough., Disp: 30 capsule, Rfl: 0   Blood Pressure Monitoring (BLOOD PRESSURE KIT) DEVI, 1 Device by Does not apply route as needed. (Patient not taking: Reported on 04/17/2020), Disp: 1 each, Rfl: 0   ferrous sulfate 325 (65 FE) MG tablet, Take 1 tablet (325 mg total) by mouth every other day. (Patient not taking: Reported on 04/17/2020), Disp: , Rfl: 3   ibuprofen (ADVIL) 600 MG tablet, Take 1 tablet (600 mg total) by mouth every 6 (six) hours as needed. (Patient not taking: Reported on 04/17/2020), Disp: 30 tablet, Rfl: 0   phenazopyridine (PYRIDIUM) 200 MG tablet, Take 1 tablet (200 mg total) by mouth 3 (three) times daily as needed for pain (urethral spasm). (Patient not taking: Reported on 04/17/2020), Disp: 10 tablet, Rfl: 0   vitamin C (ASCORBIC ACID) 250 MG tablet, Take 1 tablet (250 mg total) by mouth every other day. Take with iron (Patient not taking: Reported on 04/17/2020), Disp: , Rfl:    Medications ordered in this encounter:  Meds ordered this encounter  Medications   sulfamethoxazole-trimethoprim (BACTRIM DS)  800-160 MG tablet    Sig: Take 1 tablet by mouth 2 (two) times daily.    Dispense:  10 tablet    Refill:  0    Order Specific Question:   Supervising Provider    Answer:   Chase Picket [id number]     *If you need refills on other medications prior to your next appointment, please contact your pharmacy*  Follow-Up: Call back or seek an in-person evaluation if the symptoms worsen or if the condition fails to improve as anticipated.  [place] [phone]  Other Instructions  Urinary Tract Infection, Adult A urinary tract infection (UTI) is an infection of any part of the urinary tract. The urinary tract includes: The kidneys. The ureters. The bladder. The urethra. These organs make, store, and get rid of pee (urine) in the body. What are the causes? This infection is caused by germs (bacteria) in your genital area. These germs grow and cause swelling (inflammation) of your urinary tract. What increases the risk? The following factors may make you more likely to develop this condition: Using a small, thin tube (catheter) to drain pee. Not being able to control when you pee or poop (incontinence). Being female. If you are female, these things can increase the risk: Using these methods to prevent pregnancy: A medicine that kills sperm (spermicide). A device that blocks sperm (diaphragm). Having low levels of a female hormone (estrogen). Being pregnant. You are more likely to develop this condition if: You have genes that add to your risk.  You are sexually active. You take antibiotic medicines. You have trouble peeing because of: A prostate that is bigger than normal, if you are female. A blockage in the part of your body that drains pee from the bladder. A kidney stone. A nerve condition that affects your bladder. Not getting enough to drink. Not peeing often enough. You have other conditions, such as: Diabetes. A weak disease-fighting system (immune  system). Sickle cell disease. Gout. Injury of the spine. What are the signs or symptoms? Symptoms of this condition include: Needing to pee right away. Peeing small amounts often. Pain or burning when peeing. Blood in the pee. Pee that smells bad or not like normal. Trouble peeing. Pee that is cloudy. Fluid coming from the vagina, if you are female. Pain in the belly or lower back. Other symptoms include: Vomiting. Not feeling hungry. Feeling mixed up (confused). This may be the first symptom in older adults. Being tired and grouchy (irritable). A fever. Watery poop (diarrhea). How is this treated? Taking antibiotic medicine. Taking other medicines. Drinking enough water. In some cases, you may need to see a specialist. Follow these instructions at home:  Medicines Take over-the-counter and prescription medicines only as told by your doctor. If you were prescribed an antibiotic medicine, take it as told by your doctor. Do not stop taking it even if you start to feel better. General instructions Make sure you: Pee until your bladder is empty. Do not hold pee for a long time. Empty your bladder after sex. Wipe from front to back after peeing or pooping if you are a female. Use each tissue one time when you wipe. Drink enough fluid to keep your pee pale yellow. Keep all follow-up visits. Contact a doctor if: You do not get better after 1-2 days. Your symptoms go away and then come back. Get help right away if: You have very bad back pain. You have very bad pain in your lower belly. You have a fever. You have chills. You feeling like you will vomit or you vomit. Summary A urinary tract infection (UTI) is an infection of any part of the urinary tract. This condition is caused by germs in your genital area. There are many risk factors for a UTI. Treatment includes antibiotic medicines. Drink enough fluid to keep your pee pale yellow. This information is not intended  to replace advice given to you by your health care provider. Make sure you discuss any questions you have with your health care provider. Document Revised: 08/27/2019 Document Reviewed: 08/27/2019 Elsevier Patient Education  [place].    If you have been instructed to have an in-person evaluation today at a local Urgent Care facility, please use the link below. It will take you to a list of all of our available [facility], including address, phone number and hours of operation. Please do not delay care.  [facility]  If you or a family member do not have a primary care provider, use the link below to schedule a visit and establish care. When you choose a [facility] primary care physician or advanced practice provider, you gain a long-term partner in health. Find a Primary Care Provider  Learn more about [facility]'s in-office and virtual care options: [facility]

## 2021-11-01 ENCOUNTER — Telehealth: Payer: Medicaid Other | Admitting: Family Medicine

## 2021-11-01 DIAGNOSIS — K0889 Other specified disorders of teeth and supporting structures: Secondary | ICD-10-CM

## 2021-11-01 NOTE — Progress Notes (Signed)
   Needs in person care given uncontrolled Pain.  Patient acknowledged agreement and understanding of the plan.

## 2022-03-18 ENCOUNTER — Telehealth: Payer: Medicaid Other | Admitting: Physician Assistant

## 2022-03-18 DIAGNOSIS — B001 Herpesviral vesicular dermatitis: Secondary | ICD-10-CM | POA: Diagnosis not present

## 2022-03-18 MED ORDER — VALACYCLOVIR HCL 1 G PO TABS: 2000.0000 mg | ORAL_TABLET | Freq: Two times a day (BID) | ORAL | 0 refills | Status: AC

## 2022-03-18 NOTE — Progress Notes (Signed)
Virtual Visit Consent   Kristina Goodwin, you are scheduled for a virtual visit with a [facility] provider today. Just as with appointments in the office, your consent must be obtained to participate. Your consent will be active for this visit and any virtual visit you may have with one of our providers in the next 365 days. If you have a MyChart account, a copy of this consent can be sent to you electronically.  As this is a virtual visit, video technology does not allow for your provider to perform a traditional examination. This may limit your provider's ability to fully assess your condition. If your provider identifies any concerns that need to be evaluated in person or the need to arrange testing (such as labs, EKG, etc.), we will make arrangements to do so. Although advances in technology are sophisticated, we cannot ensure that it will always work on either your end or our end. If the connection with a video visit is poor, the visit may have to be switched to a telephone visit. With either a video or telephone visit, we are not always able to ensure that we have a secure connection.  By engaging in this virtual visit, you consent to the provision of healthcare and authorize for your insurance to be billed (if applicable) for the services provided during this visit. Depending on your insurance coverage, you may receive a charge related to this service.  I need to obtain your verbal consent now. Are you willing to proceed with your visit today? Kristina Goodwin has provided verbal consent on 03/18/2022 for a virtual visit (video or telephone). Leeanne Rio, [place]  Date: 03/18/2022 1:08 PM  Virtual Visit via Video Note   I, Leeanne Rio, connected with  Kristina Goodwin  ([id number], [birth year]) on 03/18/22 at 12:45 PM EST by a video-enabled telemedicine application and verified that I am speaking with the correct person using two identifiers.  Location: Patient: Virtual Visit  Location Patient: Home Provider: Virtual Visit Location Provider: Home Office   I discussed the limitations of evaluation and management by telemedicine and the availability of in person appointments. The patient expressed understanding and agreed to proceed.    History of Present Illness: Kristina Goodwin is a 32 y.o. who identifies as a female who was assigned female at birth, and is being seen today for cold sore x 2 days  of lower lip. Denies fevers, chills, malaise. First cold sore in a very long time. -- dry and irritated lips. New facial lotion recently that may have precipitated this issue.  Is applying topical Abreva.   HPI: HPI  Problems:  Patient Active Problem List   Diagnosis Date Noted   H/O bilateral salpingectomy     Allergies: No Known Allergies Medications:  Current Outpatient Medications:    valACYclovir (VALTREX) 1000 MG tablet, Take 2 tablets (2,000 mg total) by mouth 2 (two) times daily for 1 day., Disp: 4 tablet, Rfl: 0   Blood Pressure Monitoring (BLOOD PRESSURE KIT) DEVI, 1 Device by Does not apply route as needed. (Patient not taking: Reported on 04/17/2020), Disp: 1 each, Rfl: 0   ferrous sulfate 325 (65 FE) MG tablet, Take 1 tablet (325 mg total) by mouth every other day. (Patient not taking: Reported on 04/17/2020), Disp: , Rfl: 3   ibuprofen (ADVIL) 600 MG tablet, Take 1 tablet (600 mg total) by mouth every 6 (six) hours as needed. (Patient not taking: Reported on 04/17/2020), Disp: 30 tablet, Rfl:  0  Observations/Objective: Patient is well-developed, well-nourished in no acute distress.  Resting comfortably at home.  Head is normocephalic, atraumatic.  No labored breathing. Speech is clear and coherent with logical content.  Patient is alert and oriented at baseline.  Vesicular lesion of the right side of lower lip noted, not sparing vermilion border.  Assessment and Plan: 1. Fever blister - valACYclovir (VALTREX) 1000 MG tablet; Take 2 tablets (2,000 mg  total) by mouth 2 (two) times daily for 1 day.  Dispense: 4 tablet; Refill: 0  Start Valtrex. Supportive measures reviewed. Ok to continue topical Abreva. Strict in-person follow-up precautions reviewed.   Follow Up Instructions: I discussed the assessment and treatment plan with the patient. The patient was provided an opportunity to ask questions and all were answered. The patient agreed with the plan and demonstrated an understanding of the instructions.  A copy of instructions were sent to the patient via MyChart unless otherwise noted below.   The patient was advised to call back or seek an in-person evaluation if the symptoms worsen or if the condition fails to improve as anticipated.  Time:  I spent 10 minutes with the patient via telehealth technology discussing the above problems/concerns.    Leeanne Rio, PA-C

## 2022-03-18 NOTE — Patient Instructions (Signed)
Kristina Goodwin, thank you for joining Leeanne Rio, PA-C for today's virtual visit.  While this provider is not your primary care provider (PCP), if your PCP is located in our provider database this encounter information will be shared with them immediately following your visit.   [facility] account gives you access to today's visit and all your visits, tests, and labs performed at [facility] " click here if you don't have a [facility] account or go to [email or web address]  Consent: (Patient) Kristina Goodwin provided verbal consent for this virtual visit at the beginning of the encounter.  Current Medications:  Current Outpatient Medications:    acetaminophen (TYLENOL) 500 MG tablet, Take 2 tablets (1,000 mg total) by mouth every 6 (six) hours as needed. (Patient not taking: Reported on 04/17/2020), Disp: , Rfl:    benzonatate (TESSALON) 100 MG capsule, Take 1 capsule (100 mg total) by mouth 3 (three) times daily as needed for cough., Disp: 30 capsule, Rfl: 0   Blood Pressure Monitoring (BLOOD PRESSURE KIT) DEVI, 1 Device by Does not apply route as needed. (Patient not taking: Reported on 04/17/2020), Disp: 1 each, Rfl: 0   ferrous sulfate 325 (65 FE) MG tablet, Take 1 tablet (325 mg total) by mouth every other day. (Patient not taking: Reported on 04/17/2020), Disp: , Rfl: 3   ibuprofen (ADVIL) 600 MG tablet, Take 1 tablet (600 mg total) by mouth every 6 (six) hours as needed. (Patient not taking: Reported on 04/17/2020), Disp: 30 tablet, Rfl: 0   phenazopyridine (PYRIDIUM) 200 MG tablet, Take 1 tablet (200 mg total) by mouth 3 (three) times daily as needed for pain (urethral spasm). (Patient not taking: Reported on 04/17/2020), Disp: 10 tablet, Rfl: 0   sulfamethoxazole-trimethoprim (BACTRIM DS) 800-160 MG tablet, Take 1 tablet by mouth 2 (two) times daily., Disp: 10 tablet, Rfl: 0   vitamin C (ASCORBIC ACID) 250 MG tablet, Take 1 tablet (250 mg total)  by mouth every other day. Take with iron (Patient not taking: Reported on 04/17/2020), Disp: , Rfl:    Medications ordered in this encounter:  No orders of the defined types were placed in this encounter.    *If you need refills on other medications prior to your next appointment, please contact your pharmacy*  Follow-Up: Call back or seek an in-person evaluation if the symptoms worsen or if the condition fails to improve as anticipated.  [facility] [phone]  Other Instructions Cold Sore  A cold sore, also called a fever blister, is a small, fluid-filled sore that forms inside of the mouth or on the lips, gums, nose, chin, or cheeks. Cold sores can spread to other parts of the body, such as the eyes, fingers, or genitals. Cold sores can spread from person to person (are contagious) until the sores crust over completely. Most cold sores go away within 2 weeks. What are the causes? Cold sores are caused by a virus (herpes simplex virus type 1, HSV-1). The virus can spread from person to person through close contact, such as through: Kissing. Touching the affected area. Sharing personal items such as lip balm, razors, a drinking glass, or eating utensils. What increases the risk? Being tired, stressed, or sick. Having your period (menstruating). Being pregnant. Taking certain medicines. Being out in cold weather or getting too much sun. What are the signs or symptoms? Symptoms of a cold sore go through different stages: Tingling, itching, or burning is felt 1-2 days before  the cold sore appears. Fluid-filled blisters appear on the lips, inside the mouth, on the nose, or on the cheeks. The blisters start to ooze clear fluid. The blisters dry up, and a yellow crust appears in their place. The crust falls off. In some cases, other symptoms can develop along with cold sores. These can include: Fever. Sore throat. Headache. Muscle aches. Swollen neck glands. How  is this treated? There is no cure for cold sores or the virus that causes them. There is also no vaccine to prevent the virus. Most cold sores go away on their own without treatment within 2 weeks. Your doctor may prescribe medicines to: Help with pain. Keep the virus from growing. Help you heal faster. Medicines may be in the form of creams, gels, pills, or a shot. Follow these instructions at home: Medicines Take or apply over-the-counter and prescription medicines only as told by your doctor. Use a cotton-tip swab to apply creams or gels to your sores. Ask your doctor if you can take lysine supplements. These may help with healing. Sore care  Do not touch the sores or pick the scabs. Wash your hands often with soap and water for at least 20 seconds. Do not touch your eyes without washing your hands first. Keep the sores clean and dry. If told, put ice on the sores. To do this: Put ice in a plastic bag. Place a towel between your skin and the bag. Leave the ice on for 20 minutes, 2-3 times a day. Take off the ice if your skin turns bright red. This is very important. If you cannot feel pain, heat, or cold, you have a greater risk of damage to the area. Eating and drinking Eat a soft, bland diet. Avoid eating hot, cold, or salty foods. These can hurt your mouth. Use a straw if it hurts to drink out of a glass. Eat foods that have a lot of lysine in them. These include meat, fish, and dairy products. Avoid sugary foods, chocolates, nuts, and grains. These foods have a high amount of a substance (arginine) that can cause the virus to grow. Lifestyle Do not kiss, have oral sex, or share personal items until your sores heal. Stress, poor sleep, and being out in the sun can trigger a cold sore. Make sure you: Do activities that help you relax, such as deep breathing exercises or meditation. Get enough sleep. Put sunscreen on your lips before you go out in the sun. Contact a doctor  if: You have symptoms for more than 2 weeks. You have pus coming from the sores. You have redness that is spreading. You have pain or irritation in your eye. You get sores on your genitals. Your sores do not heal within 2 weeks. You get cold sores often. Get help right away if: You have a fever and your symptoms suddenly get worse. You have a headache and confusion. You have tiredness (fatigue). You do not want to eat as much as normal (loss of appetite). You have a stiff neck or are sensitive to light. Summary A cold sore is a small, fluid-filled sore that forms inside of the mouth or on the lips, gums, nose, chin, or cheeks. Cold sores can spread from person to person (are contagious) until the sores crust over completely. Most cold sores go away within 2 weeks. Wash your hands often. Do not touch your eyes without washing your hands first. Do not kiss, have oral sex, or share personal items until your sores  heal. Contact a doctor if your sores do not heal within 2 weeks. This information is not intended to replace advice given to you by your health care provider. Make sure you discuss any questions you have with your health care provider. Document Revised: 10/25/2020 Document Reviewed: 10/25/2020 Elsevier Patient Education  [place].    If you have been instructed to have an in-person evaluation today at a local Urgent Care facility, please use the link below. It will take you to a list of all of our available [facility], including address, phone number and hours of operation. Please do not delay care.  [facility]  If you or a family member do not have a primary care provider, use the link below to schedule a visit and establish care. When you choose a [facility] primary care physician or advanced practice provider, you gain a long-term partner in health. Find a Primary Care Provider  Learn more about 's in-office and virtual care  options: [facility]

## 2022-05-01 ENCOUNTER — Telehealth: Payer: Medicaid Other | Admitting: Nurse Practitioner

## 2022-05-01 DIAGNOSIS — N3000 Acute cystitis without hematuria: Secondary | ICD-10-CM | POA: Diagnosis not present

## 2022-05-01 MED ORDER — NITROFURANTOIN MONOHYD MACRO 100 MG PO CAPS: 100.0000 mg | ORAL_CAPSULE | Freq: Two times a day (BID) | ORAL | 0 refills | Status: AC

## 2022-05-01 NOTE — Progress Notes (Signed)

## 2023-06-30 ENCOUNTER — Encounter: Admitting: Physician Assistant

## 2023-06-30 NOTE — Progress Notes (Signed)
 Patient needing to see PCP for Wellness exam and cancer screenings.  Marked erroneous and NO charge  This encounter was created in error - please disregard.
